# Patient Record
Sex: Female | Born: 1955 | Race: White | Hispanic: No | Marital: Married
Health system: Southern US, Community
[De-identification: ages and names within clinical notes are randomized; demographics above are authoritative.]

---

## 2003-12-03 ENCOUNTER — Inpatient Hospital Stay (HOSPITAL_COMMUNITY): Admission: RE | Admit: 2003-12-03 | Discharge: 2003-12-06 | Payer: Self-pay | Admitting: Orthopedic Surgery

## 2012-09-14 DIAGNOSIS — Z0000 Encounter for general adult medical examination without abnormal findings: Secondary | ICD-10-CM | POA: Insufficient documentation

## 2013-04-24 DIAGNOSIS — F331 Major depressive disorder, recurrent, moderate: Secondary | ICD-10-CM | POA: Insufficient documentation

## 2013-04-24 DIAGNOSIS — F411 Generalized anxiety disorder: Secondary | ICD-10-CM | POA: Insufficient documentation

## 2015-05-13 DIAGNOSIS — E782 Mixed hyperlipidemia: Secondary | ICD-10-CM | POA: Insufficient documentation

## 2015-05-13 DIAGNOSIS — M5136 Other intervertebral disc degeneration, lumbar region: Secondary | ICD-10-CM | POA: Insufficient documentation

## 2015-05-13 DIAGNOSIS — H9190 Unspecified hearing loss, unspecified ear: Secondary | ICD-10-CM | POA: Insufficient documentation

## 2015-05-13 DIAGNOSIS — N959 Unspecified menopausal and perimenopausal disorder: Secondary | ICD-10-CM | POA: Insufficient documentation

## 2015-05-13 DIAGNOSIS — I10 Essential (primary) hypertension: Secondary | ICD-10-CM | POA: Insufficient documentation

## 2015-05-13 DIAGNOSIS — K219 Gastro-esophageal reflux disease without esophagitis: Secondary | ICD-10-CM | POA: Insufficient documentation

## 2015-05-13 DIAGNOSIS — Z79899 Other long term (current) drug therapy: Secondary | ICD-10-CM | POA: Insufficient documentation

## 2015-05-13 DIAGNOSIS — R5383 Other fatigue: Secondary | ICD-10-CM | POA: Insufficient documentation

## 2015-05-13 DIAGNOSIS — E559 Vitamin D deficiency, unspecified: Secondary | ICD-10-CM | POA: Insufficient documentation

## 2015-10-28 DIAGNOSIS — E669 Obesity, unspecified: Secondary | ICD-10-CM | POA: Insufficient documentation

## 2017-05-26 DIAGNOSIS — M159 Polyosteoarthritis, unspecified: Secondary | ICD-10-CM | POA: Insufficient documentation

## 2017-11-16 DIAGNOSIS — I83811 Varicose veins of right lower extremities with pain: Secondary | ICD-10-CM | POA: Insufficient documentation

## 2018-03-02 DIAGNOSIS — M791 Myalgia, unspecified site: Secondary | ICD-10-CM | POA: Insufficient documentation

## 2018-11-07 DIAGNOSIS — G8929 Other chronic pain: Secondary | ICD-10-CM | POA: Insufficient documentation

## 2019-06-13 DIAGNOSIS — Z800 Family history of malignant neoplasm of digestive organs: Secondary | ICD-10-CM | POA: Insufficient documentation

## 2019-08-17 DIAGNOSIS — R7303 Prediabetes: Secondary | ICD-10-CM | POA: Insufficient documentation

## 2019-09-14 ENCOUNTER — Encounter: Payer: Self-pay | Admitting: Sports Medicine

## 2019-09-14 ENCOUNTER — Ambulatory Visit (INDEPENDENT_AMBULATORY_CARE_PROVIDER_SITE_OTHER): Payer: Medicare HMO

## 2019-09-14 ENCOUNTER — Other Ambulatory Visit: Payer: Self-pay | Admitting: Sports Medicine

## 2019-09-14 ENCOUNTER — Other Ambulatory Visit: Payer: Self-pay

## 2019-09-14 ENCOUNTER — Ambulatory Visit: Payer: Medicare HMO | Admitting: Sports Medicine

## 2019-09-14 DIAGNOSIS — M79672 Pain in left foot: Secondary | ICD-10-CM

## 2019-09-14 DIAGNOSIS — M779 Enthesopathy, unspecified: Secondary | ICD-10-CM

## 2019-09-14 DIAGNOSIS — M722 Plantar fascial fibromatosis: Secondary | ICD-10-CM | POA: Diagnosis not present

## 2019-09-14 DIAGNOSIS — M216X2 Other acquired deformities of left foot: Secondary | ICD-10-CM | POA: Diagnosis not present

## 2019-09-14 DIAGNOSIS — M7662 Achilles tendinitis, left leg: Secondary | ICD-10-CM

## 2019-09-14 DIAGNOSIS — M79671 Pain in right foot: Secondary | ICD-10-CM

## 2019-09-14 MED ORDER — PREDNISONE 10 MG (21) PO TBPK: ORAL_TABLET | ORAL | 0 refills | Status: DC

## 2019-09-14 NOTE — Patient Instructions (Signed)

## 2019-09-14 NOTE — Progress Notes (Signed)
Subjective: Anna Mclaughlin is a 64 y.o. female patient who presents to office for evaluation of Left heel pain. Patient complains of progressive pain especially over the last year in the Left foot at the Achilles x 2-3 years with knot at the back. Ranks pain 10/10 and is now interferring with daily activities. Patient has tried stretching and OTC inserts with no complete relief in symptoms. Patient denies any other pedal complaints.   Review of Systems  All other systems reviewed and are negative.   There are no problems to display for this patient.   Current Outpatient Medications on File Prior to Visit  Medication Sig Dispense Refill   Armodafinil 150 MG tablet Take 1/2 to 1 a day     citalopram (CELEXA) 20 MG tablet Take 1 a day     DULoxetine (CYMBALTA) 60 MG capsule Take by mouth.     gabapentin (NEURONTIN) 300 MG capsule      gemfibrozil (LOPID) 600 MG tablet TAKE 1 TABLET BY MOUTH TWICE (2) DAILY     omeprazole (PRILOSEC) 20 MG capsule Take 20 mg by mouth daily.     piroxicam (FELDENE) 20 MG capsule Take 20 mg by mouth daily.     quinapril-hydrochlorothiazide (ACCURETIC) 20-25 MG tablet      tiZANidine (ZANAFLEX) 4 MG tablet Take by mouth.     traMADol (ULTRAM) 50 MG tablet      traZODone (DESYREL) 100 MG tablet Take 100 mg by mouth at bedtime.     No current facility-administered medications on file prior to visit.    Allergies  Allergen Reactions   Sulfa Antibiotics Hives   Penicillins Other (See Comments) and Rash    Objective:  General: Alert and oriented x3 in no acute distress  Dermatology: No open lesions bilateral lower extremities, no webspace macerations, no ecchymosis bilateral, all nails x 10 are well manicured.  Vascular: Dorsalis Pedis and Posterior Tibial pedal pulses 1/4, Capillary Fill Time 3 seconds, + pedal hair growth bilateral, no edema bilateral lower extremities, Temperature gradient within normal limits.  Neurology: Michaell Cowing sensation  intact via light touch bilateral.  Musculoskeletal: Mild tenderness with palpation at insertion of the Achilles on Left, there is calcaneal exostosis with mild soft tissue present and decreased ankle rom with knee extending  vs flexed resembling gastroc equnius bilateral, The achilles tendon feels intact with no nodularity or palpable dell, Thompson sign negative, Subtalar and midtarsal joint range of motion is within normal limits, there is no 1st ray hypermobility or forefoot deformity noted bilateral.   Gait: Antalgic gait with increased heel off left  Xrays  Left Foot    Impression: Normal osseous mineralization. Joint spaces preserved. No fracture/dislocation/boney destruction. ++ Calcaneal spur present. Kager's triangle intact with no obliteration. No soft tissue abnormalities or radiopaque foreign bodies.   Assessment and Plan: Problem List Items Addressed This Visit    None    Visit Diagnoses    Tendonitis, Achilles, left    -  Primary   Pain of left heel       Bone spur       Acquired equinus deformity of left foot          -Complete examination performed -Xrays reviewed -Discussed treatment options -Rx predisone dose pack heel lifts, gentle stretching, and Night splint -No improvement will consider MRI/PT/EPAT -Patient to return to office in 4 weeks  or sooner if condition worsens.  [name], DPM

## 2019-09-17 ENCOUNTER — Other Ambulatory Visit: Payer: Self-pay | Admitting: Sports Medicine

## 2019-09-17 DIAGNOSIS — M722 Plantar fascial fibromatosis: Secondary | ICD-10-CM

## 2019-10-05 ENCOUNTER — Other Ambulatory Visit: Payer: Self-pay

## 2019-10-05 ENCOUNTER — Encounter: Payer: Self-pay | Admitting: Sports Medicine

## 2019-10-05 ENCOUNTER — Ambulatory Visit: Payer: Medicare HMO | Admitting: Sports Medicine

## 2019-10-05 DIAGNOSIS — M216X2 Other acquired deformities of left foot: Secondary | ICD-10-CM | POA: Diagnosis not present

## 2019-10-05 DIAGNOSIS — M779 Enthesopathy, unspecified: Secondary | ICD-10-CM

## 2019-10-05 DIAGNOSIS — M79672 Pain in left foot: Secondary | ICD-10-CM | POA: Diagnosis not present

## 2019-10-05 DIAGNOSIS — M7662 Achilles tendinitis, left leg: Secondary | ICD-10-CM | POA: Diagnosis not present

## 2019-10-05 DIAGNOSIS — S86012S Strain of left Achilles tendon, sequela: Secondary | ICD-10-CM

## 2019-10-05 NOTE — Progress Notes (Signed)
Subjective: Anna Mclaughlin is a 64 y.o. female patient who returns to office for follow up evaluation of Left heel pain. Patient reports that pain is now worse, very severe, back of heel is worse after 8 hour shift with sharp pain, + swelling with constant throbbing. Patient reports that her pain meds and her nerve meds do not help.    There are no problems to display for this patient.   Current Outpatient Medications on File Prior to Visit  Medication Sig Dispense Refill  . Armodafinil 150 MG tablet Take 1/2 to 1 a day    . citalopram (CELEXA) 20 MG tablet Take 1 a day    . DULoxetine (CYMBALTA) 60 MG capsule Take by mouth.    . gabapentin (NEURONTIN) 300 MG capsule     . gemfibrozil (LOPID) 600 MG tablet TAKE 1 TABLET BY MOUTH TWICE (2) DAILY    . omeprazole (PRILOSEC) 20 MG capsule Take 20 mg by mouth daily.    . piroxicam (FELDENE) 20 MG capsule Take 20 mg by mouth daily.    . predniSONE (STERAPRED UNI-PAK 21 TAB) 10 MG (21) TBPK tablet Take as directed 21 tablet 0  . quinapril-hydrochlorothiazide (ACCURETIC) 20-25 MG tablet     . tiZANidine (ZANAFLEX) 4 MG tablet Take by mouth.    . traMADol (ULTRAM) 50 MG tablet     . traZODone (DESYREL) 100 MG tablet Take 100 mg by mouth at bedtime.     No current facility-administered medications on file prior to visit.    Allergies  Allergen Reactions  . Sulfa Antibiotics Hives  . Penicillins Other (See Comments) and Rash    Objective:  General: Alert and oriented x3 in no acute distress  Dermatology: No open lesions bilateral lower extremities, no webspace macerations, no ecchymosis bilateral, all nails x 10 are well manicured.  Vascular: Dorsalis Pedis and Posterior Tibial pedal pulses 1/4, Capillary Fill Time 3 seconds, + pedal hair growth bilateral, no edema bilateral lower extremities, Temperature gradient within normal limits.  Neurology: Michaell Cowing sensation intact via light touch bilateral.  Musculoskeletal: Moderate tenderness  with palpation at insertion of the Achilles on Left, there is calcaneal exostosis with mild soft tissue swelling present and decreased ankle rom with knee extending  vs flexed resembling gastroc equnius bilateral, The achilles tendon feels intact with no nodularity or palpable dell, Thompson sign negative.   Assessment and Plan: Problem List Items Addressed This Visit    None    Visit Diagnoses    Partial Achilles tendon tear, left, sequela    -  Primary   Relevant Orders   MR ANKLE LEFT WO CONTRAST   Tendonitis, Achilles, left       Relevant Orders   MR ANKLE LEFT WO CONTRAST   Pain of left heel       Bone spur       Acquired equinus deformity of left foot          -Complete examination performed -Discussed treatment options for increased achilles pain with concern for partial tear -Rx MRI left ankle to eval for tear since she is no better with conservative care with increase in pain -Rx CAM boot with toe guard  -Continue with gentle stretching, and Night splint with ice pack as tolerated -Continue with chronic pain and nerve meds -Patient to return to office after MRI  or sooner if condition worsens.  [name], DPM

## 2019-10-31 ENCOUNTER — Other Ambulatory Visit: Payer: Self-pay

## 2019-10-31 ENCOUNTER — Ambulatory Visit
Admission: RE | Admit: 2019-10-31 | Discharge: 2019-10-31 | Disposition: A | Discharge status: Home / Self Care | Payer: Medicare HMO | Source: Ambulatory Visit | Attending: Sports Medicine | Admitting: Sports Medicine

## 2019-10-31 DIAGNOSIS — S86012S Strain of left Achilles tendon, sequela: Secondary | ICD-10-CM

## 2019-10-31 DIAGNOSIS — M7662 Achilles tendinitis, left leg: Secondary | ICD-10-CM

## 2019-11-08 ENCOUNTER — Telehealth: Payer: Self-pay | Admitting: *Deleted

## 2019-11-08 NOTE — Telephone Encounter (Signed)
Called and relayed the message per Dr Marylene Land and sent Lurena Joiner a message to make an appointment for the patient. Misty Stanley

## 2019-11-08 NOTE — Telephone Encounter (Signed)
-----   Message from [name], [place] sent at 11/07/2019 12:31 PM EDT ----- Will you let patient know that her MRI showed that she had inflammation at her tendon and at her achilles like I expected there is a small tear. She should keep with her CAM boot and come to office for [place] to further discuss plan of care ----- Message ----- From: Lanney Gins, [facility] Sent: 11/07/2019   8:36 AM EDT To: [name], DPM  Hi Dr Stover-patient called and had the MRI done and has been 5 days and no one has called patient to let her know what the results were. Misty Stanley

## 2019-11-20 ENCOUNTER — Ambulatory Visit (INDEPENDENT_AMBULATORY_CARE_PROVIDER_SITE_OTHER): Payer: Medicare HMO | Admitting: Sports Medicine

## 2019-11-20 ENCOUNTER — Other Ambulatory Visit: Payer: Self-pay

## 2019-11-20 ENCOUNTER — Encounter: Payer: Self-pay | Admitting: Sports Medicine

## 2019-11-20 DIAGNOSIS — M79672 Pain in left foot: Secondary | ICD-10-CM

## 2019-11-20 DIAGNOSIS — S86012S Strain of left Achilles tendon, sequela: Secondary | ICD-10-CM | POA: Diagnosis not present

## 2019-11-20 DIAGNOSIS — M216X2 Other acquired deformities of left foot: Secondary | ICD-10-CM

## 2019-11-20 DIAGNOSIS — M7662 Achilles tendinitis, left leg: Secondary | ICD-10-CM

## 2019-11-20 DIAGNOSIS — M779 Enthesopathy, unspecified: Secondary | ICD-10-CM

## 2019-11-20 NOTE — Progress Notes (Signed)
Subjective: Anna Mclaughlin is a 64 y.o. female patient who returns to office for follow up evaluation of Left heel pain and discussion of MRI results. Patient reports that pain is doing a little better but still has some swelling and throbbing and reports that the boot rubs the back of her heel making a burning sensation to the area. Patient denies any changes in medical history since last encounter.   Patient Active Problem List   Diagnosis Date Noted  . Prediabetes 08/17/2019  . Family history of malignant neoplasm of digestive organs 06/13/2019  . Chronic right-sided thoracic back pain 11/07/2018  . Myalgia 03/02/2018  . Varicose veins of right lower extremity with pain 11/16/2017  . Primary osteoarthritis involving multiple joints 05/26/2017  . Obesity (BMI 30-39.9) 10/28/2015  . Degeneration of lumbar intervertebral disc 05/13/2015  . Essential hypertension 05/13/2015  . Gastroesophageal reflux disease without esophagitis 05/13/2015  . Hearing loss 05/13/2015  . High risk medication use 05/13/2015  . Malaise and fatigue 05/13/2015  . Menopausal disorder 05/13/2015  . Mixed hyperlipidemia 05/13/2015  . Vitamin D deficiency 05/13/2015  . Generalized anxiety disorder 04/24/2013  . Moderate episode of recurrent major depressive disorder (HCC) 04/24/2013  . Routine history and physical examination of adult 09/14/2012  . Meniere's disease 07/22/2012    Current Outpatient Medications on File Prior to Visit  Medication Sig Dispense Refill  . Armodafinil 150 MG tablet Take 1/2 to 1 a day    . citalopram (CELEXA) 20 MG tablet Take 1 a day    . DULoxetine (CYMBALTA) 60 MG capsule Take by mouth.    . gabapentin (NEURONTIN) 300 MG capsule     . gemfibrozil (LOPID) 600 MG tablet TAKE 1 TABLET BY MOUTH TWICE (2) DAILY    . omeprazole (PRILOSEC) 20 MG capsule Take 20 mg by mouth daily.    . piroxicam (FELDENE) 20 MG capsule Take 20 mg by mouth daily.    . predniSONE (STERAPRED UNI-PAK 21  TAB) 10 MG (21) TBPK tablet Take as directed 21 tablet 0  . quinapril-hydrochlorothiazide (ACCURETIC) 20-25 MG tablet     . tiZANidine (ZANAFLEX) 4 MG tablet Take by mouth.    . traMADol (ULTRAM) 50 MG tablet     . traZODone (DESYREL) 100 MG tablet Take 100 mg by mouth at bedtime.     No current facility-administered medications on file prior to visit.    Allergies  Allergen Reactions  . Sulfa Antibiotics Hives  . Penicillins Other (See Comments) and Rash    Objective:  General: Alert and oriented x3 in no acute distress  Dermatology: No open lesions bilateral lower extremities, no webspace macerations, no ecchymosis bilateral, all nails x 10 are well manicured.  Vascular: Dorsalis Pedis and Posterior Tibial pedal pulses 1/4, Capillary Fill Time 3 seconds, + pedal hair growth bilateral, no edema bilateral lower extremities, Temperature gradient within normal limits.  Neurology: Michaell Cowing sensation intact via light touch bilateral.  Musculoskeletal: Moderate tenderness with palpation at insertion of the Achilles on Left, there is calcaneal exostosis with mild soft tissue swelling present and decreased ankle rom with knee extending  vs flexed resembling gastroc equnius bilateral, The achilles tendon feels intact with no nodularity or palpable dell, Thompson sign negative.  There is no pain to palpation to the lateral or medial ankle on the left.  MRI 9/15 IMPRESSION: 1. Moderate to severe mid to distal Achilles tendinosis with small partial tear. 2. Small peroneal brevis tendon split tear inferior to the lateral  malleolus. 3. 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 4. Mild posterior tibial tenosynovitis.   Assessment and Plan: Problem List Items Addressed This Visit    None    Visit Diagnoses    Partial Achilles tendon tear, left, sequela    -  Primary   Tendonitis, Achilles, left       Pain of  left heel       Bone spur       Acquired equinus deformity of left foot          -Complete examination performed -Discussed treatment options for continued pain at Achilles with partial tear -MRI results reviewed -Patient at this time declines surgery and wants to try conservative care dispensed Tri-Lock brace for patient to use with heel lift to see if this will give her some protection relief states she is rubbing in the boot -Continue with gentle stretching, and Night splint with ice pack as tolerated -Continue with chronic pain and nerve meds -Patient to return if fails to continue to improve after 2 to 3 weeks or sooner if condition worsens.  Advised patient to call if she wants to try physical therapy or if she wants to try a postoperative shoe with heel lifts if her brace fails to give her relief.  [name], DPM

## 2020-02-19 DIAGNOSIS — R1031 Right lower quadrant pain: Secondary | ICD-10-CM | POA: Diagnosis not present

## 2020-02-27 DIAGNOSIS — R5383 Other fatigue: Secondary | ICD-10-CM | POA: Diagnosis not present

## 2020-02-27 DIAGNOSIS — I10 Essential (primary) hypertension: Secondary | ICD-10-CM | POA: Diagnosis not present

## 2020-02-27 DIAGNOSIS — Z79899 Other long term (current) drug therapy: Secondary | ICD-10-CM | POA: Diagnosis not present

## 2020-02-27 DIAGNOSIS — M8949 Other hypertrophic osteoarthropathy, multiple sites: Secondary | ICD-10-CM | POA: Diagnosis not present

## 2020-02-27 DIAGNOSIS — R7303 Prediabetes: Secondary | ICD-10-CM | POA: Diagnosis not present

## 2020-02-27 DIAGNOSIS — R69 Illness, unspecified: Secondary | ICD-10-CM | POA: Diagnosis not present

## 2020-02-27 DIAGNOSIS — R5381 Other malaise: Secondary | ICD-10-CM | POA: Diagnosis not present

## 2020-02-27 DIAGNOSIS — E782 Mixed hyperlipidemia: Secondary | ICD-10-CM | POA: Diagnosis not present

## 2020-02-27 DIAGNOSIS — K219 Gastro-esophageal reflux disease without esophagitis: Secondary | ICD-10-CM | POA: Diagnosis not present

## 2020-02-27 DIAGNOSIS — M5136 Other intervertebral disc degeneration, lumbar region: Secondary | ICD-10-CM | POA: Diagnosis not present

## 2020-02-27 DIAGNOSIS — Z23 Encounter for immunization: Secondary | ICD-10-CM | POA: Diagnosis not present

## 2020-03-07 ENCOUNTER — Ambulatory Visit: Payer: Medicare HMO | Admitting: Sports Medicine

## 2020-03-07 DIAGNOSIS — F5101 Primary insomnia: Secondary | ICD-10-CM | POA: Diagnosis not present

## 2020-03-07 DIAGNOSIS — F411 Generalized anxiety disorder: Secondary | ICD-10-CM | POA: Diagnosis not present

## 2020-03-07 DIAGNOSIS — R5383 Other fatigue: Secondary | ICD-10-CM | POA: Insufficient documentation

## 2020-03-07 DIAGNOSIS — R69 Illness, unspecified: Secondary | ICD-10-CM | POA: Diagnosis not present

## 2020-03-07 DIAGNOSIS — Z79899 Other long term (current) drug therapy: Secondary | ICD-10-CM | POA: Diagnosis not present

## 2020-03-24 DIAGNOSIS — Z20822 Contact with and (suspected) exposure to covid-19: Secondary | ICD-10-CM | POA: Diagnosis not present

## 2020-03-24 DIAGNOSIS — J0100 Acute maxillary sinusitis, unspecified: Secondary | ICD-10-CM | POA: Diagnosis not present

## 2020-04-04 ENCOUNTER — Encounter: Payer: Self-pay | Admitting: Sports Medicine

## 2020-04-04 ENCOUNTER — Other Ambulatory Visit: Payer: Self-pay

## 2020-04-04 ENCOUNTER — Ambulatory Visit: Payer: Medicare HMO | Admitting: Sports Medicine

## 2020-04-04 DIAGNOSIS — S86012S Strain of left Achilles tendon, sequela: Secondary | ICD-10-CM | POA: Diagnosis not present

## 2020-04-04 DIAGNOSIS — M7662 Achilles tendinitis, left leg: Secondary | ICD-10-CM | POA: Diagnosis not present

## 2020-04-04 DIAGNOSIS — M216X2 Other acquired deformities of left foot: Secondary | ICD-10-CM | POA: Diagnosis not present

## 2020-04-04 DIAGNOSIS — M79672 Pain in left foot: Secondary | ICD-10-CM

## 2020-04-04 DIAGNOSIS — M779 Enthesopathy, unspecified: Secondary | ICD-10-CM

## 2020-04-04 MED ORDER — DICLOFENAC SODIUM 75 MG PO TBEC: 75.0000 mg | DELAYED_RELEASE_TABLET | Freq: Two times a day (BID) | ORAL | 0 refills | Status: DC

## 2020-04-04 NOTE — Progress Notes (Signed)
Subjective: Anna Mclaughlin is a 65 y.o. female patient who returns to office for follow up evaluation of Left heel pain.  Patient reports that she is still having pain states that the pain is about the same and that the brace hurt the back of the heel but the night splint and icing seem to help.  Patient is here to discuss options and also states that she has some dry skin at the back of her left heel.  Currently using foot cream.  No other pedal complaints noted.   Patient Active Problem List   Diagnosis Date Noted  . Fatigue 03/07/2020  . Prediabetes 08/17/2019  . Family history of malignant neoplasm of digestive organs 06/13/2019  . Chronic right-sided thoracic back pain 11/07/2018  . Myalgia 03/02/2018  . Varicose veins of right lower extremity with pain 11/16/2017  . Primary osteoarthritis involving multiple joints 05/26/2017  . Obesity (BMI 30-39.9) 10/28/2015  . Degeneration of lumbar intervertebral disc 05/13/2015  . Essential hypertension 05/13/2015  . Gastroesophageal reflux disease without esophagitis 05/13/2015  . Hearing loss 05/13/2015  . High risk medication use 05/13/2015  . Malaise and fatigue 05/13/2015  . Menopausal disorder 05/13/2015  . Mixed hyperlipidemia 05/13/2015  . Vitamin D deficiency 05/13/2015  . Generalized anxiety disorder 04/24/2013  . Moderate episode of recurrent major depressive disorder (HCC) 04/24/2013  . Routine history and physical examination of adult 09/14/2012  . Meniere's disease 07/22/2012    Current Outpatient Medications on File Prior to Visit  Medication Sig Dispense Refill  . Armodafinil 150 MG tablet Take 1/2 to 1 a day    . citalopram (CELEXA) 20 MG tablet Take 1 a day    . DULoxetine (CYMBALTA) 60 MG capsule Take by mouth.    . gabapentin (NEURONTIN) 300 MG capsule     . gemfibrozil (LOPID) 600 MG tablet TAKE 1 TABLET BY MOUTH TWICE (2) DAILY    . omeprazole (PRILOSEC) 20 MG capsule Take 20 mg by mouth daily.    . piroxicam  (FELDENE) 20 MG capsule Take 20 mg by mouth daily.    . predniSONE (STERAPRED UNI-PAK 21 TAB) 10 MG (21) TBPK tablet Take as directed 21 tablet 0  . quinapril-hydrochlorothiazide (ACCURETIC) 20-25 MG tablet     . tiZANidine (ZANAFLEX) 4 MG tablet Take by mouth.    . traMADol (ULTRAM) 50 MG tablet     . traZODone (DESYREL) 100 MG tablet Take 100 mg by mouth at bedtime.     No current facility-administered medications on file prior to visit.    Allergies  Allergen Reactions  . Sulfa Antibiotics Hives  . Penicillins Other (See Comments) and Rash    Objective:  General: Alert and oriented x3 in no acute distress  Dermatology: No open lesions bilateral lower extremities, no webspace macerations, no ecchymosis bilateral, all nails x 10 are well manicured.  Mild reactive dry skin noted to the posterior heel on the left likely secondary to shear from shoes.  Vascular: Dorsalis Pedis and Posterior Tibial pedal pulses 1/4, Capillary Fill Time 3 seconds, + pedal hair growth bilateral, no edema bilateral lower extremities, Temperature gradient within normal limits.  Neurology: Michaell Cowing sensation intact via light touch bilateral.  Musculoskeletal: Mild to moderate tenderness with palpation at insertion of the Achilles on Left, there is calcaneal exostosis with mild soft tissue swelling present and decreased ankle rom with knee extending  vs flexed resembling gastroc equnius bilateral, The achilles tendon feels intact with no nodularity or palpable dell, Anna Mclaughlin  sign negative.  There is no pain to palpation to the lateral or medial ankle or foot on the left.  MRI 9/15 IMPRESSION: 1. Moderate to severe mid to distal Achilles tendinosis with small partial tear. 2. Small peroneal brevis tendon split tear inferior to the lateral malleolus. 3. 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 4. Mild posterior tibial  tenosynovitis.   Assessment and Plan: Problem List Items Addressed This Visit   None   Visit Diagnoses    Partial Achilles tendon tear, left, sequela    -  Primary   Tendonitis, Achilles, left       Pain of left heel       Bone spur       Acquired equinus deformity of left foot          -Complete examination performed -Re-Discussed treatment options for continued pain at Achilles with partial tear -MRI results reviewed again -Patient at this time wants to continue with conservative care and wants to consider surgery as a last option -Rx [facility] at [facility] -Continue with gentle stretching, and Night splint with ice pack as tolerated -Recommend good supportive shoes daily for foot type -Continue with chronic pain and nerve meds like previous and changed meloxicam to diclofenac to see if patient will tolerate this better for pain and inflammation -Advised patient to continue with foot cream for dry skin at posterior heel and to use with Saran wrap under occlusion -Patient to return in 6 weeks for follow-up to see how she is doing once she has started physical therapy.  Anna Mclaughlin, DPM

## 2020-04-14 DIAGNOSIS — M25572 Pain in left ankle and joints of left foot: Secondary | ICD-10-CM | POA: Diagnosis not present

## 2020-04-14 DIAGNOSIS — M79672 Pain in left foot: Secondary | ICD-10-CM | POA: Diagnosis not present

## 2020-04-14 DIAGNOSIS — M25672 Stiffness of left ankle, not elsewhere classified: Secondary | ICD-10-CM | POA: Diagnosis not present

## 2020-04-14 DIAGNOSIS — M7662 Achilles tendinitis, left leg: Secondary | ICD-10-CM | POA: Diagnosis not present

## 2020-04-14 DIAGNOSIS — R2689 Other abnormalities of gait and mobility: Secondary | ICD-10-CM | POA: Diagnosis not present

## 2020-04-18 DIAGNOSIS — M25572 Pain in left ankle and joints of left foot: Secondary | ICD-10-CM | POA: Diagnosis not present

## 2020-04-18 DIAGNOSIS — M79672 Pain in left foot: Secondary | ICD-10-CM | POA: Diagnosis not present

## 2020-04-18 DIAGNOSIS — M25672 Stiffness of left ankle, not elsewhere classified: Secondary | ICD-10-CM | POA: Diagnosis not present

## 2020-04-18 DIAGNOSIS — R2689 Other abnormalities of gait and mobility: Secondary | ICD-10-CM | POA: Diagnosis not present

## 2020-04-18 DIAGNOSIS — M7662 Achilles tendinitis, left leg: Secondary | ICD-10-CM | POA: Diagnosis not present

## 2020-04-25 DIAGNOSIS — M25672 Stiffness of left ankle, not elsewhere classified: Secondary | ICD-10-CM | POA: Diagnosis not present

## 2020-04-25 DIAGNOSIS — R2689 Other abnormalities of gait and mobility: Secondary | ICD-10-CM | POA: Diagnosis not present

## 2020-04-25 DIAGNOSIS — M25572 Pain in left ankle and joints of left foot: Secondary | ICD-10-CM | POA: Diagnosis not present

## 2020-04-25 DIAGNOSIS — M7662 Achilles tendinitis, left leg: Secondary | ICD-10-CM | POA: Diagnosis not present

## 2020-04-25 DIAGNOSIS — M79672 Pain in left foot: Secondary | ICD-10-CM | POA: Diagnosis not present

## 2020-04-28 DIAGNOSIS — A09 Infectious gastroenteritis and colitis, unspecified: Secondary | ICD-10-CM | POA: Diagnosis not present

## 2020-05-02 DIAGNOSIS — M25672 Stiffness of left ankle, not elsewhere classified: Secondary | ICD-10-CM | POA: Diagnosis not present

## 2020-05-02 DIAGNOSIS — M25572 Pain in left ankle and joints of left foot: Secondary | ICD-10-CM | POA: Diagnosis not present

## 2020-05-02 DIAGNOSIS — M7662 Achilles tendinitis, left leg: Secondary | ICD-10-CM | POA: Diagnosis not present

## 2020-05-02 DIAGNOSIS — M79672 Pain in left foot: Secondary | ICD-10-CM | POA: Diagnosis not present

## 2020-05-02 DIAGNOSIS — R2689 Other abnormalities of gait and mobility: Secondary | ICD-10-CM | POA: Diagnosis not present

## 2020-05-09 DIAGNOSIS — Z1231 Encounter for screening mammogram for malignant neoplasm of breast: Secondary | ICD-10-CM | POA: Diagnosis not present

## 2020-05-12 DIAGNOSIS — R928 Other abnormal and inconclusive findings on diagnostic imaging of breast: Secondary | ICD-10-CM | POA: Insufficient documentation

## 2020-05-15 DIAGNOSIS — M8949 Other hypertrophic osteoarthropathy, multiple sites: Secondary | ICD-10-CM | POA: Diagnosis not present

## 2020-05-16 ENCOUNTER — Ambulatory Visit: Payer: Medicare HMO | Admitting: Sports Medicine

## 2020-05-16 ENCOUNTER — Other Ambulatory Visit: Payer: Self-pay

## 2020-05-16 ENCOUNTER — Encounter: Payer: Self-pay | Admitting: Sports Medicine

## 2020-05-16 DIAGNOSIS — M79672 Pain in left foot: Secondary | ICD-10-CM

## 2020-05-16 DIAGNOSIS — M7662 Achilles tendinitis, left leg: Secondary | ICD-10-CM | POA: Diagnosis not present

## 2020-05-16 DIAGNOSIS — S86012S Strain of left Achilles tendon, sequela: Secondary | ICD-10-CM

## 2020-05-16 DIAGNOSIS — M216X2 Other acquired deformities of left foot: Secondary | ICD-10-CM

## 2020-05-16 DIAGNOSIS — M779 Enthesopathy, unspecified: Secondary | ICD-10-CM

## 2020-05-16 MED ORDER — TRIAMCINOLONE ACETONIDE 10 MG/ML IJ SUSP
10.0000 mg | Freq: Once | INTRAMUSCULAR | Status: AC
  Administered 2020-05-16: 10 mg

## 2020-05-16 MED ORDER — PREDNISONE 10 MG (21) PO TBPK: ORAL_TABLET | ORAL | 0 refills | Status: DC

## 2020-05-16 NOTE — Progress Notes (Signed)
Subjective: Anna Mclaughlin is a 65 y.o. female patient who returns to office for follow up evaluation of Left heel pain.  Patient reports that her heel is killing her states that she can barely walk has been limping due to the pain states that physical therapy seems to make pain worse and she is ready to discuss further options because her heel is really affecting her life.  Denies any other pedal complaints at this time.   Patient Active Problem List   Diagnosis Date Noted  . Fatigue 03/07/2020  . Prediabetes 08/17/2019  . Family history of malignant neoplasm of digestive organs 06/13/2019  . Chronic right-sided thoracic back pain 11/07/2018  . Myalgia 03/02/2018  . Varicose veins of right lower extremity with pain 11/16/2017  . Primary osteoarthritis involving multiple joints 05/26/2017  . Obesity (BMI 30-39.9) 10/28/2015  . Degeneration of lumbar intervertebral disc 05/13/2015  . Essential hypertension 05/13/2015  . Gastroesophageal reflux disease without esophagitis 05/13/2015  . Hearing loss 05/13/2015  . High risk medication use 05/13/2015  . Malaise and fatigue 05/13/2015  . Menopausal disorder 05/13/2015  . Mixed hyperlipidemia 05/13/2015  . Vitamin D deficiency 05/13/2015  . Generalized anxiety disorder 04/24/2013  . Moderate episode of recurrent major depressive disorder (HCC) 04/24/2013  . Routine history and physical examination of adult 09/14/2012  . Meniere's disease 07/22/2012    Current Outpatient Medications on File Prior to Visit  Medication Sig Dispense Refill  . Armodafinil 150 MG tablet Take 1/2 to 1 a day    . citalopram (CELEXA) 20 MG tablet Take 1 a day    . diclofenac (VOLTAREN) 75 MG EC tablet Take 1 tablet (75 mg total) by mouth 2 (two) times daily. 30 tablet 0  . DULoxetine (CYMBALTA) 60 MG capsule Take by mouth.    . gabapentin (NEURONTIN) 300 MG capsule     . gemfibrozil (LOPID) 600 MG tablet TAKE 1 TABLET BY MOUTH TWICE (2) DAILY    . omeprazole  (PRILOSEC) 20 MG capsule Take 20 mg by mouth daily.    . piroxicam (FELDENE) 20 MG capsule Take 20 mg by mouth daily.    . quinapril-hydrochlorothiazide (ACCURETIC) 20-25 MG tablet     . tiZANidine (ZANAFLEX) 4 MG tablet Take by mouth.    . traMADol (ULTRAM) 50 MG tablet     . traZODone (DESYREL) 100 MG tablet Take 100 mg by mouth at bedtime.     No current facility-administered medications on file prior to visit.    Allergies  Allergen Reactions  . Sulfa Antibiotics Hives  . Penicillins Other (See Comments) and Rash    Objective:  General: Alert and oriented x3 in no acute distress  Dermatology: No open lesions bilateral lower extremities, no webspace macerations, no ecchymosis bilateral, all nails x 10 are well manicured.  Mild reactive dry skin to heels bilateral.  Vascular: Dorsalis Pedis and Posterior Tibial pedal pulses 1/4, Capillary Fill Time 3 seconds, + pedal hair growth bilateral, no edema bilateral lower extremities, Temperature gradient within normal limits.  Neurology: Michaell Cowing sensation intact via light touch bilateral.  Musculoskeletal: Mild to moderate tenderness with palpation at insertion of the Achilles on Left, most pain today lateral> central> medial, there is calcaneal exostosis with mild soft tissue swelling present and decreased ankle rom with knee extending  vs flexed resembling gastroc equnius bilateral, The achilles tendon feels intact with no nodularity or palpable dell, Thompson sign negative.  There is no pain to palpation to the lateral or medial  ankle or foot on the left.   Assessment and Plan: Problem List Items Addressed This Visit   None   Visit Diagnoses    Tendonitis, Achilles, left    -  Primary   Partial Achilles tendon tear, left, sequela       Pain of left heel       Bone spur       Acquired equinus deformity of left foot          -Complete examination performed -Re-Discussed treatment options for continued pain at Achilles with  partial tear -Advised patient since she is hurting more progressively and therapy is making things worse to discontinue and to consider surgery.  Patient reports that she wants to wait to have surgery in June; advised her to return to office for surgery consult -Meanwhile to provide temporary relief prescribed prednisone for patient to take as directed and administered a local injection, After oral consent and aseptic prep, injected a mixture containing 1 ml of 2%  plain lidocaine, 1 ml 0.5% plain marcaine, 0.5 ml of kenalog 10 and 0.5 ml of dexamethasone phosphate into along the lateral insertion being careful not to further damage the Achilles tendon.  Patient tolerated injection well without complication. Post-injection care discussed with patient.  -Advised patient if pain continues may return to her walking boot that she has at home -Continue with gentle stretching, and Night splint with ice pack as tolerated -Recommend good supportive shoes daily for foot type -Patient to return as scheduled for surgery consult  [name], DPM

## 2020-06-04 DIAGNOSIS — R928 Other abnormal and inconclusive findings on diagnostic imaging of breast: Secondary | ICD-10-CM | POA: Diagnosis not present

## 2020-07-11 DIAGNOSIS — M9262 Juvenile osteochondrosis of tarsus, left ankle: Secondary | ICD-10-CM | POA: Diagnosis not present

## 2020-07-11 DIAGNOSIS — M79604 Pain in right leg: Secondary | ICD-10-CM | POA: Diagnosis not present

## 2020-07-18 ENCOUNTER — Ambulatory Visit (INDEPENDENT_AMBULATORY_CARE_PROVIDER_SITE_OTHER): Payer: Medicare HMO

## 2020-07-18 ENCOUNTER — Other Ambulatory Visit: Payer: Self-pay

## 2020-07-18 ENCOUNTER — Ambulatory Visit: Payer: Medicare HMO | Admitting: Sports Medicine

## 2020-07-18 ENCOUNTER — Encounter: Payer: Self-pay | Admitting: Sports Medicine

## 2020-07-18 DIAGNOSIS — M216X2 Other acquired deformities of left foot: Secondary | ICD-10-CM | POA: Diagnosis not present

## 2020-07-18 DIAGNOSIS — S86012S Strain of left Achilles tendon, sequela: Secondary | ICD-10-CM

## 2020-07-18 DIAGNOSIS — M79672 Pain in left foot: Secondary | ICD-10-CM

## 2020-07-18 DIAGNOSIS — M722 Plantar fascial fibromatosis: Secondary | ICD-10-CM

## 2020-07-18 DIAGNOSIS — M779 Enthesopathy, unspecified: Secondary | ICD-10-CM | POA: Diagnosis not present

## 2020-07-18 NOTE — Progress Notes (Signed)
Subjective: Anna Mclaughlin is a 65 y.o. female patient who returns to office for follow up evaluation of Left heel pain.  Patient reports that her heel is hurting really badly went for a walk on the beach and felt a pull in something pop and has had significant swelling at the back of her heel area since.  Patient reports that she has been resting icing elevating her gabapentin and tramadol helps with the pain but has not been wearing her cam boot because it rubs the back of her heel and seems to make her pain worse to where she cannot stand the.  Denies any other pedal complaints at this time.   Patient Active Problem List   Diagnosis Date Noted  . Fatigue 03/07/2020  . Prediabetes 08/17/2019  . Family history of malignant neoplasm of digestive organs 06/13/2019  . Chronic right-sided thoracic back pain 11/07/2018  . Myalgia 03/02/2018  . Varicose veins of right lower extremity with pain 11/16/2017  . Primary osteoarthritis involving multiple joints 05/26/2017  . Obesity (BMI 30-39.9) 10/28/2015  . Degeneration of lumbar intervertebral disc 05/13/2015  . Essential hypertension 05/13/2015  . Gastroesophageal reflux disease without esophagitis 05/13/2015  . Hearing loss 05/13/2015  . High risk medication use 05/13/2015  . Malaise and fatigue 05/13/2015  . Menopausal disorder 05/13/2015  . Mixed hyperlipidemia 05/13/2015  . Vitamin D deficiency 05/13/2015  . Generalized anxiety disorder 04/24/2013  . Moderate episode of recurrent major depressive disorder (HCC) 04/24/2013  . Routine history and physical examination of adult 09/14/2012  . Meniere's disease 07/22/2012    Current Outpatient Medications on File Prior to Visit  Medication Sig Dispense Refill  . Armodafinil 150 MG tablet Take 1/2 to 1 a day    . citalopram (CELEXA) 20 MG tablet Take 1 a day    . diclofenac (VOLTAREN) 75 MG EC tablet Take 1 tablet (75 mg total) by mouth 2 (two) times daily. 30 tablet 0  . DULoxetine  (CYMBALTA) 60 MG capsule Take by mouth.    . gabapentin (NEURONTIN) 300 MG capsule     . gemfibrozil (LOPID) 600 MG tablet TAKE 1 TABLET BY MOUTH TWICE (2) DAILY    . omeprazole (PRILOSEC) 20 MG capsule Take 20 mg by mouth daily.    . piroxicam (FELDENE) 20 MG capsule Take 20 mg by mouth daily.    . predniSONE (STERAPRED UNI-PAK 21 TAB) 10 MG (21) TBPK tablet Take as directed 21 tablet 0  . quinapril-hydrochlorothiazide (ACCURETIC) 20-25 MG tablet     . tiZANidine (ZANAFLEX) 4 MG tablet Take by mouth.    . traMADol (ULTRAM) 50 MG tablet     . traZODone (DESYREL) 100 MG tablet Take 100 mg by mouth at bedtime.     No current facility-administered medications on file prior to visit.    Allergies  Allergen Reactions  . Sulfa Antibiotics Hives  . Penicillins Other (See Comments) and Rash    Objective:  General: Alert and oriented x3 in no acute distress  Dermatology: No open lesions bilateral lower extremities, no webspace macerations, no ecchymosis bilateral, all nails x 10 are well manicured.  Mild reactive dry skin to heels bilateral.  Vascular: Dorsalis Pedis and Posterior Tibial pedal pulses 1/4, Capillary Fill Time 3 seconds, + pedal hair growth bilateral, no edema bilateral lower extremities, Temperature gradient within normal limits.  Neurology: Michaell Cowing sensation intact via light touch bilateral.  Musculoskeletal: Moderate tenderness with palpation at insertion of the Achilles on Left, most pain today  lateral> central> medial, there is calcaneal exostosis with moderate soft tissue swelling present and decreased ankle rom with knee extending  vs flexed resembling gastroc equnius bilateral, The achilles tendon feels intact partially with now a small area of nodularity and a small palpable dell noted to the medial aspect of her tendon at the watershed area, [place] sign negative with likely some fibers intact.  There is no pain to palpation to the lateral or medial ankle or foot on the  left.  There is some pain today to the plantar heel along the plantar fascial insertion on the left.  X-rays show significant posterior and inferior heel spur shows a lot of soft tissue swelling and mild impingement on the EKG's triangle   Assessment and Plan: Problem List Items Addressed This Visit   None   Visit Diagnoses    Partial Achilles tendon tear, left, sequela    -  Primary   Relevant Orders   DG Ankle Complete Left (Completed)   For home use only DME Other see comment   Plantar fasciitis       Bone spur       Pain of left heel       Acquired equinus deformity of left foot          -Complete examination performed -Re-Discussed treatment options for continued pain at Achilles with partial tear that has likely worsened with patient walking on the beach and with no heel pain -Advised patient since she is hurting more progressively with worsening symptoms of tear we need to proceed with surgical treatments -Patient opt for surgical management. Consent obtained for left Achilles tendon repair with removal of heel spur both posterior and plantar and release of plantar fascia.  Pre and Post op course explained. Risks, benefits, alternatives explained. No guarantees given or implied. Surgical booking slip submitted and provided patient with Surgical packet and info for [facility] -Patient to be placed in a posterior splint immediate postop -Continue rest ice elevation -Ordered knee scooter for patient to be nonweightbearing on the left lower extremity -Advised patient that she will need a least 3 months out of work to bring any work/FMLA/disability paperwork to my office to be completed on her behalf starting with disability now and for her postoperative recovery -Patient to return as scheduled after surgery or sooner if issues arise. [name], DPM

## 2020-07-22 ENCOUNTER — Telehealth: Payer: Self-pay

## 2020-07-22 NOTE — Telephone Encounter (Signed)
Rep. From [facility] called stating they don't carry knee scooters any more and Dr. Laury Axon to send the Rx for knee scooter to another faciltiy.

## 2020-07-24 DIAGNOSIS — H524 Presbyopia: Secondary | ICD-10-CM | POA: Diagnosis not present

## 2020-07-24 DIAGNOSIS — Z0100 Encounter for examination of eyes and vision without abnormal findings: Secondary | ICD-10-CM | POA: Diagnosis not present

## 2020-07-24 NOTE — Telephone Encounter (Signed)
Pt will come and pick up Rx today

## 2020-07-24 NOTE — Telephone Encounter (Signed)
HI Anna Mclaughlin, by any chance did Dr. Marylene Land gave you pt's Rx for knee scooter? Thanks

## 2020-07-25 ENCOUNTER — Telehealth: Payer: Self-pay | Admitting: Urology

## 2020-07-25 NOTE — Telephone Encounter (Signed)
DOS - 08/04/20   EPF LEFT --- 54562 REPAIR TENDON LEFT --- 28086 CALCANEAL OSTECTOMY LEFT --- 56389 HEEL SPUR RESECTION LEFT --- 37342   [employer] EFFECTIVE DATE - 05/17/19   SPOKE WITH AYUSH WITH [employer] AND HE STATED THAT FOR CPT CODE 87681, 15726, 20355 AND 97416 NO PRIOR AUTH IS REQUIRED.  REF # [id number]

## 2020-07-28 ENCOUNTER — Encounter: Payer: Self-pay | Admitting: Sports Medicine

## 2020-08-01 DIAGNOSIS — M79676 Pain in unspecified toe(s): Secondary | ICD-10-CM

## 2020-08-02 ENCOUNTER — Other Ambulatory Visit: Payer: Self-pay | Admitting: Sports Medicine

## 2020-08-02 DIAGNOSIS — Z9889 Other specified postprocedural states: Secondary | ICD-10-CM

## 2020-08-02 NOTE — Progress Notes (Signed)
Postoperative medications entered Creatinine clearance calculated at 111 mg/dL for Xarelto

## 2020-08-04 ENCOUNTER — Encounter: Payer: Self-pay | Admitting: Sports Medicine

## 2020-08-04 DIAGNOSIS — S86012A Strain of left Achilles tendon, initial encounter: Secondary | ICD-10-CM | POA: Diagnosis not present

## 2020-08-04 DIAGNOSIS — M7732 Calcaneal spur, left foot: Secondary | ICD-10-CM | POA: Diagnosis not present

## 2020-08-04 DIAGNOSIS — M25571 Pain in right ankle and joints of right foot: Secondary | ICD-10-CM | POA: Diagnosis not present

## 2020-08-04 DIAGNOSIS — M722 Plantar fascial fibromatosis: Secondary | ICD-10-CM | POA: Diagnosis not present

## 2020-08-04 DIAGNOSIS — M7662 Achilles tendinitis, left leg: Secondary | ICD-10-CM | POA: Diagnosis not present

## 2020-08-04 MED ORDER — DOCUSATE SODIUM 100 MG PO CAPS: 100.0000 mg | ORAL_CAPSULE | Freq: Two times a day (BID) | ORAL | 0 refills | Status: DC

## 2020-08-04 MED ORDER — HYDROCODONE-ACETAMINOPHEN 10-325 MG PO TABS: 1.0000 | ORAL_TABLET | Freq: Four times a day (QID) | ORAL | 0 refills | Status: AC | PRN

## 2020-08-04 MED ORDER — RIVAROXABAN 10 MG PO TABS: 10.0000 mg | ORAL_TABLET | Freq: Every day | ORAL | 0 refills | Status: AC

## 2020-08-04 MED ORDER — PROMETHAZINE HCL 25 MG PO TABS: 25.0000 mg | ORAL_TABLET | Freq: Three times a day (TID) | ORAL | 0 refills | Status: AC | PRN

## 2020-08-05 ENCOUNTER — Telehealth: Payer: Self-pay | Admitting: Sports Medicine

## 2020-08-05 NOTE — Telephone Encounter (Signed)
Postoperative check phone call made to patient.  Patient reports that she is doing good no pain currently.  Advised patient that this is common after having a nerve block and to continue with medication as the nerve block slowly starts to wear off.  Advised patient to continue with rest ice elevation and nonweightbearing to the left lower extremity.  I explained in detail to the patient the severity of her Achilles injury and the procedure that I had to do in order to help remedy her issue.  Patient expressed understanding and I advised patient because she is nonweightbearing to make sure she picks up her Xarelto.  Patient reports that her daughter will pick it up today because the pharmacy did not have it on yesterday.  Patient denies any other symptoms at this time.  I advised patient if there is any other postoperative questions or issues to call office.  Patient thanked me for calling and reports that the nurses at the surgical center were very kind to her. -Dr. Marylene Land

## 2020-08-12 ENCOUNTER — Encounter: Payer: Self-pay | Admitting: Sports Medicine

## 2020-08-12 ENCOUNTER — Ambulatory Visit (INDEPENDENT_AMBULATORY_CARE_PROVIDER_SITE_OTHER): Payer: Medicare HMO

## 2020-08-12 ENCOUNTER — Other Ambulatory Visit: Payer: Self-pay

## 2020-08-12 ENCOUNTER — Ambulatory Visit (INDEPENDENT_AMBULATORY_CARE_PROVIDER_SITE_OTHER): Payer: Medicare HMO | Admitting: Sports Medicine

## 2020-08-12 DIAGNOSIS — M79672 Pain in left foot: Secondary | ICD-10-CM | POA: Diagnosis not present

## 2020-08-12 DIAGNOSIS — S86012S Strain of left Achilles tendon, sequela: Secondary | ICD-10-CM

## 2020-08-12 DIAGNOSIS — M779 Enthesopathy, unspecified: Secondary | ICD-10-CM

## 2020-08-12 DIAGNOSIS — Z9889 Other specified postprocedural states: Secondary | ICD-10-CM

## 2020-08-12 DIAGNOSIS — M722 Plantar fascial fibromatosis: Secondary | ICD-10-CM

## 2020-08-12 DIAGNOSIS — M216X2 Other acquired deformities of left foot: Secondary | ICD-10-CM

## 2020-08-12 NOTE — Progress Notes (Signed)
Subjective: Anna Mclaughlin is a 65 y.o. female patient seen today in office for POV #1 DOS 08-04-20), S/P Left achilles tendon repair and EPF. Patient denies pain at surgical site, denies calf pain, denies headache, chest pain, shortness of breath, nausea, vomiting, fever, or chills. Patient states that she is doing well. No pain. No other issues noted.   Patient Active Problem List   Diagnosis Date Noted   Fatigue 03/07/2020   Prediabetes 08/17/2019   Family history of malignant neoplasm of digestive organs 06/13/2019   Chronic right-sided thoracic back pain 11/07/2018   Myalgia 03/02/2018   Varicose veins of right lower extremity with pain 11/16/2017   Primary osteoarthritis involving multiple joints 05/26/2017   Obesity (BMI 30-39.9) 10/28/2015   Degeneration of lumbar intervertebral disc 05/13/2015   Essential hypertension 05/13/2015   Gastroesophageal reflux disease without esophagitis 05/13/2015   Hearing loss 05/13/2015   High risk medication use 05/13/2015   Malaise and fatigue 05/13/2015   Menopausal disorder 05/13/2015   Mixed hyperlipidemia 05/13/2015   Vitamin D deficiency 05/13/2015   Generalized anxiety disorder 04/24/2013   Moderate episode of recurrent major depressive disorder (HCC) 04/24/2013   Routine history and physical examination of adult 09/14/2012   Meniere's disease 07/22/2012    Current Outpatient Medications on File Prior to Visit  Medication Sig Dispense Refill   Armodafinil 150 MG tablet Take 1/2 to 1 a day     citalopram (CELEXA) 20 MG tablet Take 1 a day     diclofenac (VOLTAREN) 75 MG EC tablet Take 1 tablet (75 mg total) by mouth 2 (two) times daily. 30 tablet 0   docusate sodium (COLACE) 100 MG capsule Take 1 capsule (100 mg total) by mouth 2 (two) times daily. 10 capsule 0   DULoxetine (CYMBALTA) 60 MG capsule Take by mouth.     gabapentin (NEURONTIN) 300 MG capsule      gemfibrozil (LOPID) 600 MG tablet TAKE 1 TABLET BY MOUTH TWICE (2) DAILY      omeprazole (PRILOSEC) 20 MG capsule Take 20 mg by mouth daily.     omeprazole (PRILOSEC) 40 MG capsule Take 40 mg by mouth daily.     piroxicam (FELDENE) 20 MG capsule Take 20 mg by mouth daily.     predniSONE (STERAPRED UNI-PAK 21 TAB) 10 MG (21) TBPK tablet Take as directed 21 tablet 0   promethazine (PHENERGAN) 25 MG tablet Take 1 tablet (25 mg total) by mouth every 8 (eight) hours as needed for nausea or vomiting. 20 tablet 0   quinapril-hydrochlorothiazide (ACCURETIC) 20-25 MG tablet      rivaroxaban (XARELTO) 10 MG TABS tablet Take 1 tablet (10 mg total) by mouth daily. 30 tablet 0   tiZANidine (ZANAFLEX) 4 MG tablet Take by mouth.     traMADol (ULTRAM) 50 MG tablet      traZODone (DESYREL) 100 MG tablet Take 100 mg by mouth at bedtime.     No current facility-administered medications on file prior to visit.    Allergies  Allergen Reactions   Sulfa Antibiotics Hives   Penicillins Other (See Comments) and Rash    Objective: There were no vitals filed for this visit.  General: No acute distress, AAOx3  Left foot: Sutures and staples intact with no gapping or dehiscence at surgical sites, mild swelling to left foot, no erythema, no warmth, no drainage, no signs of infection noted, Capillary fill time <3 seconds in all digits, gross sensation present via light touch to left foot. No  pain or crepitation with range of motion left foot.  No pain with calf compression.   Post Op Xray, Left foot: Calcaneal ostectomy, posterior, Soft tissue swelling within normal limits for post op status.   Assessment and Plan:  Problem List Items Addressed This Visit   None Visit Diagnoses     Partial Achilles tendon tear, left, sequela    -  Primary   Relevant Orders   DG Foot 2 Views Left   Plantar fasciitis       S/P foot surgery, left       Bone spur       Pain of left heel       Acquired equinus deformity of left foot            -Patient seen and evaluated -Xrays reviewed   -Re-applied posterior splint and dry sterile dressing to surgical site Left foot secured with ACE wrap and stockinet  -Advised patient to make sure to keep dressings clean, dry, and intact to left surgical site, removing the ACE as needed  -Advised patient to continue with nonweightbearing with use of crutches/scooter -Continue with Xarelto  -Advised patient to limit activity to necessity  -Advised patient to ice and elevate as instructed -Will plan for possible suture removal and reapplication of posterior splint at next office visit. In the meantime, patient to call office if any issues or problems arise.   Anna Mclaughlin, DPM

## 2020-08-21 ENCOUNTER — Encounter: Payer: Self-pay | Admitting: Podiatry

## 2020-08-21 ENCOUNTER — Other Ambulatory Visit: Payer: Self-pay

## 2020-08-21 ENCOUNTER — Ambulatory Visit (INDEPENDENT_AMBULATORY_CARE_PROVIDER_SITE_OTHER): Payer: Medicare HMO | Admitting: Podiatry

## 2020-08-21 DIAGNOSIS — S86012S Strain of left Achilles tendon, sequela: Secondary | ICD-10-CM

## 2020-08-21 DIAGNOSIS — Z9889 Other specified postprocedural states: Secondary | ICD-10-CM

## 2020-08-21 DIAGNOSIS — S86012D Strain of left Achilles tendon, subsequent encounter: Secondary | ICD-10-CM

## 2020-08-21 DIAGNOSIS — M722 Plantar fascial fibromatosis: Secondary | ICD-10-CM

## 2020-08-21 NOTE — Progress Notes (Signed)
  Subjective:  Patient ID: Anna Mclaughlin, female    DOB: 11-May-1955,  MRN: [id number]  Chief Complaint  Patient presents with   Routine Post Op    The staples are not my friend today and they are irritating me on the left foot     DOS: 08/04/20 Procedure:  S/P Left achilles tendon repair and EPF  65 y.o. female presents with the above complaint. History confirmed with patient. Pain overall controlled, not taking pain medication. Reports irritation from her stitches/staples. Denies N/V/F/Ch or other post-op concerns. Has been NWB as directed. Splint clean without signs of wear.  Objective:  Physical Exam: tenderness at the surgical site, local edema noted, and calf supple, nontender. Incision: healing well, no significant drainage, no dehiscence, no significant erythema  Assessment:   1. Partial Achilles tendon tear, left, sequela   2. Plantar fasciitis   3. S/P foot surgery, left     Plan:  Patient was evaluated and treated and all questions answered.  Post-operative State -Partial suture removal today. Proximal sutures at Achilles removed. Several left intact distally. Medial heel sutures left intact. -Dry sterile dressing and splint reapplied. -No need for pain med refill today. -NWB LLE -XRs needed at follow-up: none   No follow-ups on file.

## 2020-08-29 ENCOUNTER — Other Ambulatory Visit: Payer: Self-pay

## 2020-08-29 ENCOUNTER — Ambulatory Visit (INDEPENDENT_AMBULATORY_CARE_PROVIDER_SITE_OTHER): Payer: Medicare HMO | Admitting: Sports Medicine

## 2020-08-29 ENCOUNTER — Encounter: Payer: Self-pay | Admitting: Sports Medicine

## 2020-08-29 DIAGNOSIS — F5101 Primary insomnia: Secondary | ICD-10-CM | POA: Diagnosis not present

## 2020-08-29 DIAGNOSIS — S86012D Strain of left Achilles tendon, subsequent encounter: Secondary | ICD-10-CM

## 2020-08-29 DIAGNOSIS — F411 Generalized anxiety disorder: Secondary | ICD-10-CM | POA: Diagnosis not present

## 2020-08-29 DIAGNOSIS — M79672 Pain in left foot: Secondary | ICD-10-CM

## 2020-08-29 DIAGNOSIS — R69 Illness, unspecified: Secondary | ICD-10-CM | POA: Diagnosis not present

## 2020-08-29 DIAGNOSIS — Z9889 Other specified postprocedural states: Secondary | ICD-10-CM

## 2020-08-29 DIAGNOSIS — M722 Plantar fascial fibromatosis: Secondary | ICD-10-CM

## 2020-08-29 DIAGNOSIS — M216X2 Other acquired deformities of left foot: Secondary | ICD-10-CM

## 2020-08-29 DIAGNOSIS — S86012S Strain of left Achilles tendon, sequela: Secondary | ICD-10-CM

## 2020-08-29 DIAGNOSIS — Z79899 Other long term (current) drug therapy: Secondary | ICD-10-CM | POA: Diagnosis not present

## 2020-08-29 DIAGNOSIS — M779 Enthesopathy, unspecified: Secondary | ICD-10-CM

## 2020-08-29 MED ORDER — DOCUSATE SODIUM 100 MG PO CAPS: 100.0000 mg | ORAL_CAPSULE | Freq: Two times a day (BID) | ORAL | 0 refills | Status: AC

## 2020-08-29 NOTE — Progress Notes (Signed)
Subjective: Anna Mclaughlin is a 65 y.o. female patient seen today in office for POV #3 DOS 08-04-20), S/P Left achilles tendon repair and EPF. Patient denies pain at surgical site, tired of being off the foot and sitting around, denies calf pain, denies headache, chest pain, shortness of breath, nausea, vomiting, fever, or chills.  No calf pain. No other issues noted.   Patient Active Problem List   Diagnosis Date Noted   Haglund's deformity of left heel 07/11/2020   Abnormality of left breast on screening mammogram 05/12/2020   Fatigue 03/07/2020   Prediabetes 08/17/2019   Family history of malignant neoplasm of digestive organs 06/13/2019   Chronic right-sided thoracic back pain 11/07/2018   Myalgia 03/02/2018   Varicose veins of right lower extremity with pain 11/16/2017   Primary osteoarthritis involving multiple joints 05/26/2017   Obesity (BMI 30-39.9) 10/28/2015   Degeneration of lumbar intervertebral disc 05/13/2015   Essential hypertension 05/13/2015   Gastroesophageal reflux disease without esophagitis 05/13/2015   Hearing loss 05/13/2015   High risk medication use 05/13/2015   Malaise and fatigue 05/13/2015   Menopausal disorder 05/13/2015   Mixed hyperlipidemia 05/13/2015   Vitamin D deficiency 05/13/2015   Generalized anxiety disorder 04/24/2013   Moderate episode of recurrent major depressive disorder (HCC) 04/24/2013   Routine history and physical examination of adult 09/14/2012   Meniere's disease 07/22/2012    Current Outpatient Medications on File Prior to Visit  Medication Sig Dispense Refill   Armodafinil 150 MG tablet Take 1/2 to 1 a day     citalopram (CELEXA) 20 MG tablet Take 1 a day     diclofenac (VOLTAREN) 75 MG EC tablet Take 1 tablet (75 mg total) by mouth 2 (two) times daily. 30 tablet 0   DULoxetine (CYMBALTA) 60 MG capsule Take by mouth.     gabapentin (NEURONTIN) 300 MG capsule      gemfibrozil (LOPID) 600 MG tablet TAKE 1 TABLET BY MOUTH TWICE  (2) DAILY     omeprazole (PRILOSEC) 20 MG capsule Take 20 mg by mouth daily.     omeprazole (PRILOSEC) 40 MG capsule Take 40 mg by mouth daily.     piroxicam (FELDENE) 20 MG capsule Take 20 mg by mouth daily.     predniSONE (STERAPRED UNI-PAK 21 TAB) 10 MG (21) TBPK tablet Take as directed 21 tablet 0   promethazine (PHENERGAN) 25 MG tablet Take 1 tablet (25 mg total) by mouth every 8 (eight) hours as needed for nausea or vomiting. 20 tablet 0   quinapril-hydrochlorothiazide (ACCURETIC) 20-25 MG tablet      rivaroxaban (XARELTO) 10 MG TABS tablet Take 1 tablet (10 mg total) by mouth daily. 30 tablet 0   tiZANidine (ZANAFLEX) 4 MG tablet Take by mouth.     traMADol (ULTRAM) 50 MG tablet      traZODone (DESYREL) 100 MG tablet Take 100 mg by mouth at bedtime.     No current facility-administered medications on file prior to visit.    Allergies  Allergen Reactions   Sulfa Antibiotics Hives   Penicillins Other (See Comments) and Rash    Objective: There were no vitals filed for this visit.  General: No acute distress, AAOx3  Left foot: Sutures and staples intact with no gapping or dehiscence at surgical sites, mild swelling to left foot, no erythema, no warmth, no drainage, no signs of infection noted, Capillary fill time <3 seconds in all digits, gross sensation present via light touch to left foot. No pain  or crepitation with range of motion left foot.  No pain with calf compression.   Assessment and Plan:  Problem List Items Addressed This Visit   None Visit Diagnoses     Partial Achilles tendon tear, left, sequela    -  Primary   S/P foot surgery, left       Relevant Medications   docusate sodium (COLACE) 100 MG capsule   Plantar fasciitis       Bone spur       Pain of left heel       Acquired equinus deformity of left foot            -Patient seen and evaluated -Sutures removed at posterior heel but left intact medially  -Staples left intact -Applied purple fiberglass  below the knee cast to left lower extremity  -Advised patient to make sure to keep cast clean, dry, and intact to left surgical site, refrain from sticking things down into the cast -Advised patient to continue with nonweightbearing with use of crutches/scooter -Continue with Xarelto until completed -Advised patient to limit activity to necessity  -Advised patient to ice and elevate as instructed -Will plan for cast change and xrays at next office visit. In the meantime, patient to call office if any issues or problems arise.   [name], DPM

## 2020-09-03 ENCOUNTER — Encounter: Payer: Medicare HMO | Admitting: Sports Medicine

## 2020-09-05 DIAGNOSIS — E559 Vitamin D deficiency, unspecified: Secondary | ICD-10-CM | POA: Diagnosis not present

## 2020-09-05 DIAGNOSIS — R5383 Other fatigue: Secondary | ICD-10-CM | POA: Diagnosis not present

## 2020-09-05 DIAGNOSIS — Z0000 Encounter for general adult medical examination without abnormal findings: Secondary | ICD-10-CM | POA: Diagnosis not present

## 2020-09-05 DIAGNOSIS — R69 Illness, unspecified: Secondary | ICD-10-CM | POA: Diagnosis not present

## 2020-09-05 DIAGNOSIS — E782 Mixed hyperlipidemia: Secondary | ICD-10-CM | POA: Diagnosis not present

## 2020-09-05 DIAGNOSIS — H918X1 Other specified hearing loss, right ear: Secondary | ICD-10-CM | POA: Diagnosis not present

## 2020-09-05 DIAGNOSIS — R5381 Other malaise: Secondary | ICD-10-CM | POA: Diagnosis not present

## 2020-09-05 DIAGNOSIS — I83811 Varicose veins of right lower extremities with pain: Secondary | ICD-10-CM | POA: Diagnosis not present

## 2020-09-05 DIAGNOSIS — I10 Essential (primary) hypertension: Secondary | ICD-10-CM | POA: Diagnosis not present

## 2020-09-05 DIAGNOSIS — K219 Gastro-esophageal reflux disease without esophagitis: Secondary | ICD-10-CM | POA: Diagnosis not present

## 2020-09-05 DIAGNOSIS — R7303 Prediabetes: Secondary | ICD-10-CM | POA: Diagnosis not present

## 2020-09-05 DIAGNOSIS — M159 Polyosteoarthritis, unspecified: Secondary | ICD-10-CM | POA: Diagnosis not present

## 2020-09-05 DIAGNOSIS — Z79899 Other long term (current) drug therapy: Secondary | ICD-10-CM | POA: Diagnosis not present

## 2020-09-12 ENCOUNTER — Ambulatory Visit (INDEPENDENT_AMBULATORY_CARE_PROVIDER_SITE_OTHER): Payer: Medicare HMO | Admitting: Sports Medicine

## 2020-09-12 ENCOUNTER — Encounter: Payer: Self-pay | Admitting: Sports Medicine

## 2020-09-12 ENCOUNTER — Other Ambulatory Visit: Payer: Self-pay

## 2020-09-12 DIAGNOSIS — M722 Plantar fascial fibromatosis: Secondary | ICD-10-CM | POA: Diagnosis not present

## 2020-09-12 DIAGNOSIS — M79672 Pain in left foot: Secondary | ICD-10-CM

## 2020-09-12 DIAGNOSIS — M779 Enthesopathy, unspecified: Secondary | ICD-10-CM

## 2020-09-12 DIAGNOSIS — S86012S Strain of left Achilles tendon, sequela: Secondary | ICD-10-CM

## 2020-09-12 DIAGNOSIS — Z9889 Other specified postprocedural states: Secondary | ICD-10-CM | POA: Diagnosis not present

## 2020-09-12 NOTE — Progress Notes (Signed)
Subjective: Anna Mclaughlin is a 65 y.o. female patient seen today in office for POV # 4 DOS 08-04-20), S/P Left achilles tendon repair and EPF. Patient denies pain at surgical site, patient reports that she is ready to walk, denies calf pain, denies headache, chest pain, shortness of breath, nausea, vomiting, fever, or chills.  No calf pain. No other issues noted.   Patient Active Problem List   Diagnosis Date Noted   Haglund's deformity of left heel 07/11/2020   Abnormality of left breast on screening mammogram 05/12/2020   Fatigue 03/07/2020   Prediabetes 08/17/2019   Family history of malignant neoplasm of digestive organs 06/13/2019   Chronic right-sided thoracic back pain 11/07/2018   Myalgia 03/02/2018   Varicose veins of right lower extremity with pain 11/16/2017   Primary osteoarthritis involving multiple joints 05/26/2017   Obesity (BMI 30-39.9) 10/28/2015   Degeneration of lumbar intervertebral disc 05/13/2015   Essential hypertension 05/13/2015   Gastroesophageal reflux disease without esophagitis 05/13/2015   Hearing loss 05/13/2015   High risk medication use 05/13/2015   Malaise and fatigue 05/13/2015   Menopausal disorder 05/13/2015   Mixed hyperlipidemia 05/13/2015   Vitamin D deficiency 05/13/2015   Generalized anxiety disorder 04/24/2013   Moderate episode of recurrent major depressive disorder (HCC) 04/24/2013   Routine history and physical examination of adult 09/14/2012   Meniere's disease 07/22/2012    Current Outpatient Medications on File Prior to Visit  Medication Sig Dispense Refill   Armodafinil 150 MG tablet Take 1/2 to 1 a day     citalopram (CELEXA) 20 MG tablet Take 1 a day     diclofenac (VOLTAREN) 75 MG EC tablet Take 1 tablet (75 mg total) by mouth 2 (two) times daily. 30 tablet 0   docusate sodium (COLACE) 100 MG capsule Take 1 capsule (100 mg total) by mouth 2 (two) times daily. 10 capsule 0   DULoxetine (CYMBALTA) 60 MG capsule Take by mouth.      gabapentin (NEURONTIN) 300 MG capsule      gemfibrozil (LOPID) 600 MG tablet TAKE 1 TABLET BY MOUTH TWICE (2) DAILY     omeprazole (PRILOSEC) 20 MG capsule Take 20 mg by mouth daily.     omeprazole (PRILOSEC) 40 MG capsule Take 40 mg by mouth daily.     piroxicam (FELDENE) 20 MG capsule Take 20 mg by mouth daily.     predniSONE (STERAPRED UNI-PAK 21 TAB) 10 MG (21) TBPK tablet Take as directed 21 tablet 0   promethazine (PHENERGAN) 25 MG tablet Take 1 tablet (25 mg total) by mouth every 8 (eight) hours as needed for nausea or vomiting. 20 tablet 0   quinapril-hydrochlorothiazide (ACCURETIC) 20-25 MG tablet      rivaroxaban (XARELTO) 10 MG TABS tablet Take 1 tablet (10 mg total) by mouth daily. 30 tablet 0   tiZANidine (ZANAFLEX) 4 MG tablet Take by mouth.     traMADol (ULTRAM) 50 MG tablet      traZODone (DESYREL) 100 MG tablet Take 100 mg by mouth at bedtime.     No current facility-administered medications on file prior to visit.    Allergies  Allergen Reactions   Sulfa Antibiotics Hives   Penicillins Other (See Comments) and Rash    Objective: There were no vitals filed for this visit.  General: No acute distress, AAOx3  Left foot: Remaining staples intact with no gapping or dehiscence at surgical sites, mild swelling to left foot, no erythema, no warmth, no drainage, no signs  of infection noted, Capillary fill time <3 seconds in all digits, gross sensation present via light touch to left foot. No pain or crepitation with range of motion left foot.  No pain with calf compression.   Assessment and Plan:  Problem List Items Addressed This Visit   None Visit Diagnoses     S/P foot surgery, left    -  Primary   Partial Achilles tendon tear, left, sequela       Plantar fasciitis       Bone spur       Pain of left heel            -Patient seen and evaluated -A few staples removed -Applied red fiberglass below the knee cast to left lower extremity  -Advised patient to make  sure to keep cast clean, dry, and intact to left surgical site, refrain from sticking things down into the cast like before -Advised patient to continue with nonweightbearing with use of crutches/scooter -Continue with Xarelto until completed -Advised patient to limit activity to necessity  -Advised patient to ice and elevate as instructed -Will plan for cast change and xrays and remove remaining staples at next office visit. In the meantime, patient to call office if any issues or problems arise.   [name], DPM

## 2020-09-26 ENCOUNTER — Encounter: Payer: Self-pay | Admitting: Sports Medicine

## 2020-09-26 ENCOUNTER — Ambulatory Visit (INDEPENDENT_AMBULATORY_CARE_PROVIDER_SITE_OTHER): Payer: Medicare HMO | Admitting: Sports Medicine

## 2020-09-26 ENCOUNTER — Other Ambulatory Visit: Payer: Self-pay

## 2020-09-26 ENCOUNTER — Ambulatory Visit (INDEPENDENT_AMBULATORY_CARE_PROVIDER_SITE_OTHER): Payer: Medicare HMO

## 2020-09-26 DIAGNOSIS — M79672 Pain in left foot: Secondary | ICD-10-CM

## 2020-09-26 DIAGNOSIS — S86012S Strain of left Achilles tendon, sequela: Secondary | ICD-10-CM | POA: Diagnosis not present

## 2020-09-26 DIAGNOSIS — M216X2 Other acquired deformities of left foot: Secondary | ICD-10-CM

## 2020-09-26 DIAGNOSIS — M722 Plantar fascial fibromatosis: Secondary | ICD-10-CM

## 2020-09-26 DIAGNOSIS — M779 Enthesopathy, unspecified: Secondary | ICD-10-CM

## 2020-09-26 DIAGNOSIS — Z9889 Other specified postprocedural states: Secondary | ICD-10-CM

## 2020-09-26 NOTE — Progress Notes (Signed)
Subjective: Anna Mclaughlin is a 65 y.o. female patient seen today in office for POV # 5 DOS 08-04-20), S/P Left achilles tendon repair and EPF. Patient denies pain at surgical site, patient reports that she is ready to walk and have the rest of the staples out, denies calf pain, denies headache, chest pain, shortness of breath, nausea, vomiting, fever, or chills.  No calf pain. No other issues noted.   Patient Active Problem List   Diagnosis Date Noted   Haglund's deformity of left heel 07/11/2020   Abnormality of left breast on screening mammogram 05/12/2020   Fatigue 03/07/2020   Prediabetes 08/17/2019   Family history of malignant neoplasm of digestive organs 06/13/2019   Chronic right-sided thoracic back pain 11/07/2018   Myalgia 03/02/2018   Varicose veins of right lower extremity with pain 11/16/2017   Primary osteoarthritis involving multiple joints 05/26/2017   Obesity (BMI 30-39.9) 10/28/2015   Degeneration of lumbar intervertebral disc 05/13/2015   Essential hypertension 05/13/2015   Gastroesophageal reflux disease without esophagitis 05/13/2015   Hearing loss 05/13/2015   High risk medication use 05/13/2015   Malaise and fatigue 05/13/2015   Menopausal disorder 05/13/2015   Mixed hyperlipidemia 05/13/2015   Vitamin D deficiency 05/13/2015   Generalized anxiety disorder 04/24/2013   Moderate episode of recurrent major depressive disorder (HCC) 04/24/2013   Routine history and physical examination of adult 09/14/2012   Meniere's disease 07/22/2012    Current Outpatient Medications on File Prior to Visit  Medication Sig Dispense Refill   Armodafinil 150 MG tablet Take 1/2 to 1 a day     citalopram (CELEXA) 20 MG tablet Take 1 a day     diclofenac (VOLTAREN) 75 MG EC tablet Take 1 tablet (75 mg total) by mouth 2 (two) times daily. 30 tablet 0   docusate sodium (COLACE) 100 MG capsule Take 1 capsule (100 mg total) by mouth 2 (two) times daily. 10 capsule 0   DULoxetine  (CYMBALTA) 60 MG capsule Take by mouth.     gabapentin (NEURONTIN) 300 MG capsule      gemfibrozil (LOPID) 600 MG tablet TAKE 1 TABLET BY MOUTH TWICE (2) DAILY     omeprazole (PRILOSEC) 20 MG capsule Take 20 mg by mouth daily.     omeprazole (PRILOSEC) 40 MG capsule Take 40 mg by mouth daily.     piroxicam (FELDENE) 20 MG capsule Take 20 mg by mouth daily.     predniSONE (STERAPRED UNI-PAK 21 TAB) 10 MG (21) TBPK tablet Take as directed 21 tablet 0   promethazine (PHENERGAN) 25 MG tablet Take 1 tablet (25 mg total) by mouth every 8 (eight) hours as needed for nausea or vomiting. 20 tablet 0   quinapril-hydrochlorothiazide (ACCURETIC) 20-25 MG tablet      rivaroxaban (XARELTO) 10 MG TABS tablet Take 1 tablet (10 mg total) by mouth daily. 30 tablet 0   tiZANidine (ZANAFLEX) 4 MG tablet Take by mouth.     traMADol (ULTRAM) 50 MG tablet      traZODone (DESYREL) 100 MG tablet Take 100 mg by mouth at bedtime.     No current facility-administered medications on file prior to visit.    Allergies  Allergen Reactions   Sulfa Antibiotics Hives   Penicillins Other (See Comments) and Rash    Objective: There were no vitals filed for this visit.  General: No acute distress, AAOx3  Left foot: Remaining staples intact with no gapping or dehiscence at surgical sites, mild swelling to left foot,  no erythema, no warmth, no drainage, no signs of infection noted, Capillary fill time <3 seconds in all digits, gross sensation present via light touch to left foot. No pain or crepitation with range of motion left foot.  No pain with calf compression.   Assessment and Plan:  Problem List Items Addressed This Visit   None Visit Diagnoses     Partial Achilles tendon tear, left, sequela    -  Primary   Relevant Orders   DG Foot Complete Left (Completed)   S/P foot surgery, left       Plantar fasciitis       Bone spur       Pain of left heel       Acquired equinus deformity of left foot             -Patient seen and evaluated -X-rays consistent with postoperative state -Remaining staples removed -Applied stockinette and advised patient that she may shower on tomorrow and allow dead skin to fall off on its own may use antibiotic cream to the incision twice a week -Patient may now weight-bear with surgical shoe states she does not want to use the cam boot because she does not like how heavy it is; advised patient to use cane when attempting to ambulate -Advised patient to limit activity to necessity  -Advised patient to ice and elevate as instructed -Will plan for postoperative check and repeat x-rays at next visit. In the meantime, patient to call office if any issues or problems arise.   [name], DPM

## 2020-09-29 ENCOUNTER — Telehealth: Payer: Self-pay

## 2020-09-29 NOTE — Telephone Encounter (Signed)
Pt is experiencing a lot of pain. She cant walk on her foot and its hurting really bad. She would like to know if she needs to come in for an appointment.

## 2020-09-30 NOTE — Telephone Encounter (Signed)
Called pt to advised her that per Dr. Marylene Land she should still use her knee scooter or try to use a walker that's normal to have pain at 1st because she has been nonweightbearing and casted for so long. And if she is still struggling we can add on Physical therapy.  Pt states her most concern is her swelling at the bottom of the heel radiating up the leg (no redness). Pt states she can't stand for too long and she has been doing PT stretchings at home with no help and her Rt foot has started to bother her as well and she sees like her veins are busting or popping out.

## 2020-10-01 NOTE — Telephone Encounter (Signed)
Contacted pt and Relayed Dr. Wynema Birch message and  Dr. Nolon Stalls PT . Pt declined PT, she states she is having a lot of pain and she already knows PT wound do and no one would be touching her foot the way it feels.

## 2020-10-07 ENCOUNTER — Telehealth: Payer: Self-pay | Admitting: Sports Medicine

## 2020-10-07 NOTE — Telephone Encounter (Signed)
Pt states when her feet are swelling her skin is breaking open & bleeding on foot that you did surgery on.  Pt has POV 10-21-20-pls advise.

## 2020-10-08 NOTE — Telephone Encounter (Signed)
Contacted pt to relay Dr. Wynema Birch suggestions. Pt stated understanding

## 2020-10-21 ENCOUNTER — Ambulatory Visit (INDEPENDENT_AMBULATORY_CARE_PROVIDER_SITE_OTHER): Payer: Medicare HMO

## 2020-10-21 ENCOUNTER — Ambulatory Visit (INDEPENDENT_AMBULATORY_CARE_PROVIDER_SITE_OTHER): Payer: Medicare HMO | Admitting: Sports Medicine

## 2020-10-21 ENCOUNTER — Encounter: Payer: Self-pay | Admitting: Sports Medicine

## 2020-10-21 ENCOUNTER — Other Ambulatory Visit: Payer: Self-pay

## 2020-10-21 DIAGNOSIS — S86012S Strain of left Achilles tendon, sequela: Secondary | ICD-10-CM | POA: Diagnosis not present

## 2020-10-21 DIAGNOSIS — M79672 Pain in left foot: Secondary | ICD-10-CM

## 2020-10-21 DIAGNOSIS — M779 Enthesopathy, unspecified: Secondary | ICD-10-CM

## 2020-10-21 DIAGNOSIS — M216X2 Other acquired deformities of left foot: Secondary | ICD-10-CM

## 2020-10-21 DIAGNOSIS — Z9889 Other specified postprocedural states: Secondary | ICD-10-CM

## 2020-10-21 DIAGNOSIS — M722 Plantar fascial fibromatosis: Secondary | ICD-10-CM

## 2020-10-21 DIAGNOSIS — M7662 Achilles tendinitis, left leg: Secondary | ICD-10-CM

## 2020-10-21 MED ORDER — DICLOFENAC SODIUM 75 MG PO TBEC: 75.0000 mg | DELAYED_RELEASE_TABLET | Freq: Two times a day (BID) | ORAL | 0 refills | Status: DC

## 2020-10-21 NOTE — Progress Notes (Signed)
Subjective: Anna Mclaughlin is a 65 y.o. female patient seen today in office for POV # 6 DOS 08-04-20), S/P Left achilles tendon repair and EPF. Patient admits pain to the lateral heel and swelling states that the pain is worse the more that she is up on her foot.  Patient also admits peeling and bleeding skin at the back of the heel has been applying antibiotic ointment and soaking.  Denies calf pain, denies headache, chest pain, shortness of breath, nausea, vomiting, fever, or chills.  No calf pain. No other issues noted.   Patient Active Problem List   Diagnosis Date Noted   Haglund's deformity of left heel 07/11/2020   Abnormality of left breast on screening mammogram 05/12/2020   Fatigue 03/07/2020   Prediabetes 08/17/2019   Family history of malignant neoplasm of digestive organs 06/13/2019   Chronic right-sided thoracic back pain 11/07/2018   Myalgia 03/02/2018   Varicose veins of right lower extremity with pain 11/16/2017   Primary osteoarthritis involving multiple joints 05/26/2017   Obesity (BMI 30-39.9) 10/28/2015   Degeneration of lumbar intervertebral disc 05/13/2015   Essential hypertension 05/13/2015   Gastroesophageal reflux disease without esophagitis 05/13/2015   Hearing loss 05/13/2015   High risk medication use 05/13/2015   Malaise and fatigue 05/13/2015   Menopausal disorder 05/13/2015   Mixed hyperlipidemia 05/13/2015   Vitamin D deficiency 05/13/2015   Generalized anxiety disorder 04/24/2013   Moderate episode of recurrent major depressive disorder (HCC) 04/24/2013   Routine history and physical examination of adult 09/14/2012   Meniere's disease 07/22/2012    Current Outpatient Medications on File Prior to Visit  Medication Sig Dispense Refill   Armodafinil 150 MG tablet Take 1/2 to 1 a day     citalopram (CELEXA) 20 MG tablet Take 1 a day     docusate sodium (COLACE) 100 MG capsule Take 1 capsule (100 mg total) by mouth 2 (two) times daily. 10 capsule 0    DULoxetine (CYMBALTA) 60 MG capsule Take by mouth.     gabapentin (NEURONTIN) 300 MG capsule      gemfibrozil (LOPID) 600 MG tablet TAKE 1 TABLET BY MOUTH TWICE (2) DAILY     omeprazole (PRILOSEC) 20 MG capsule Take 20 mg by mouth daily.     omeprazole (PRILOSEC) 40 MG capsule Take 40 mg by mouth daily.     piroxicam (FELDENE) 20 MG capsule Take 20 mg by mouth daily.     predniSONE (STERAPRED UNI-PAK 21 TAB) 10 MG (21) TBPK tablet Take as directed 21 tablet 0   promethazine (PHENERGAN) 25 MG tablet Take 1 tablet (25 mg total) by mouth every 8 (eight) hours as needed for nausea or vomiting. 20 tablet 0   quinapril-hydrochlorothiazide (ACCURETIC) 20-25 MG tablet      rivaroxaban (XARELTO) 10 MG TABS tablet Take 1 tablet (10 mg total) by mouth daily. 30 tablet 0   tiZANidine (ZANAFLEX) 4 MG tablet Take by mouth.     traMADol (ULTRAM) 50 MG tablet      traZODone (DESYREL) 100 MG tablet Take 100 mg by mouth at bedtime.     No current facility-administered medications on file prior to visit.    Allergies  Allergen Reactions   Sulfa Antibiotics Hives   Penicillins Other (See Comments) and Rash    Objective: There were no vitals filed for this visit.  General: No acute distress, AAOx3 except crying during this visit patient reports that she has a lot of stress going on right now  Left foot: Gapping at the posterior heel less than 0.2cm with a granular base, mild swelling to left foot and posterior heel lateral aspect, no erythema, no warmth, no drainage, no signs of infection noted, Capillary fill time <3 seconds in all digits, gross sensation present via light touch to left foot. No pain or crepitation with range of motion left foot.  No pain with calf compression.   X-rays consistent with postoperative status with multiple areas of calcification along the Achilles tendon  Assessment and Plan:  Problem List Items Addressed This Visit   None Visit Diagnoses     Partial Achilles tendon  tear, left, sequela    -  Primary   Relevant Orders   DG Foot Complete Left   S/P foot surgery, left       Plantar fasciitis       Bone spur       Pain of left heel       Acquired equinus deformity of left foot       Tendonitis, Achilles, left            -Patient seen and evaluated -X-rays consistent with postoperative state -Applied Betadine to the left heel and advised patient to do the same daily -Applied stockinette and advised patient to wear to assist with edema control -Advised patient if pain is worsening she she will return to using her cam boot -Advised patient to limit activity to necessity  -Advised patient to ice and elevate as instructed -Prescribed diclofenac for patient to take as instructed for pain and inflammation since at this time patient refuses steroid -Will plan for postoperative check at next visit. In the meantime, patient to call office if any issues or problems arise.  We will discuss return to work versus adding on physical therapy depending on how patient is doing next visit.  [name], DPM

## 2020-10-27 ENCOUNTER — Telehealth: Payer: Self-pay

## 2020-10-27 NOTE — Telephone Encounter (Signed)
Pt called asking if Dr. Marylene Land can set her up with PT so she can get ready to go back to work. Please advice

## 2020-10-28 DIAGNOSIS — R531 Weakness: Secondary | ICD-10-CM | POA: Diagnosis not present

## 2020-10-28 DIAGNOSIS — M25572 Pain in left ankle and joints of left foot: Secondary | ICD-10-CM | POA: Diagnosis not present

## 2020-10-28 DIAGNOSIS — M25672 Stiffness of left ankle, not elsewhere classified: Secondary | ICD-10-CM | POA: Diagnosis not present

## 2020-10-31 ENCOUNTER — Ambulatory Visit (INDEPENDENT_AMBULATORY_CARE_PROVIDER_SITE_OTHER): Payer: Medicare HMO

## 2020-10-31 ENCOUNTER — Other Ambulatory Visit: Payer: Self-pay

## 2020-10-31 ENCOUNTER — Encounter: Payer: Self-pay | Admitting: Sports Medicine

## 2020-10-31 ENCOUNTER — Ambulatory Visit (INDEPENDENT_AMBULATORY_CARE_PROVIDER_SITE_OTHER): Payer: Medicare HMO | Admitting: Sports Medicine

## 2020-10-31 DIAGNOSIS — M722 Plantar fascial fibromatosis: Secondary | ICD-10-CM

## 2020-10-31 DIAGNOSIS — Z9889 Other specified postprocedural states: Secondary | ICD-10-CM

## 2020-10-31 DIAGNOSIS — S86012D Strain of left Achilles tendon, subsequent encounter: Secondary | ICD-10-CM

## 2020-10-31 DIAGNOSIS — M216X2 Other acquired deformities of left foot: Secondary | ICD-10-CM

## 2020-10-31 DIAGNOSIS — M79672 Pain in left foot: Secondary | ICD-10-CM

## 2020-10-31 DIAGNOSIS — M779 Enthesopathy, unspecified: Secondary | ICD-10-CM

## 2020-10-31 DIAGNOSIS — S86012S Strain of left Achilles tendon, sequela: Secondary | ICD-10-CM

## 2020-10-31 NOTE — Progress Notes (Signed)
Subjective: Anna Mclaughlin is a 65 y.o. female patient seen today in office for POV # 7 DOS 08-04-20), S/P Left achilles tendon repair and EPF. Patient reports that she is doing okay still has some swelling and pain left lateral heel and states that she does not like wearing the boot.  Patient did go for physical therapy for 1 session so far.  Denies any other constitutional symptoms at this time.  Patient Active Problem List   Diagnosis Date Noted   Haglund's deformity of left heel 07/11/2020   Abnormality of left breast on screening mammogram 05/12/2020   Fatigue 03/07/2020   Prediabetes 08/17/2019   Family history of malignant neoplasm of digestive organs 06/13/2019   Chronic right-sided thoracic back pain 11/07/2018   Myalgia 03/02/2018   Varicose veins of right lower extremity with pain 11/16/2017   Primary osteoarthritis involving multiple joints 05/26/2017   Obesity (BMI 30-39.9) 10/28/2015   Degeneration of lumbar intervertebral disc 05/13/2015   Essential hypertension 05/13/2015   Gastroesophageal reflux disease without esophagitis 05/13/2015   Hearing loss 05/13/2015   High risk medication use 05/13/2015   Malaise and fatigue 05/13/2015   Menopausal disorder 05/13/2015   Mixed hyperlipidemia 05/13/2015   Vitamin D deficiency 05/13/2015   Generalized anxiety disorder 04/24/2013   Moderate episode of recurrent major depressive disorder (HCC) 04/24/2013   Routine history and physical examination of adult 09/14/2012   Meniere's disease 07/22/2012    Current Outpatient Medications on File Prior to Visit  Medication Sig Dispense Refill   Armodafinil 150 MG tablet Take 1/2 to 1 a day     citalopram (CELEXA) 20 MG tablet Take 1 a day     diclofenac (VOLTAREN) 75 MG EC tablet Take 1 tablet (75 mg total) by mouth 2 (two) times daily. 30 tablet 0   docusate sodium (COLACE) 100 MG capsule Take 1 capsule (100 mg total) by mouth 2 (two) times daily. 10 capsule 0   DULoxetine  (CYMBALTA) 60 MG capsule Take by mouth.     gabapentin (NEURONTIN) 300 MG capsule      gemfibrozil (LOPID) 600 MG tablet TAKE 1 TABLET BY MOUTH TWICE (2) DAILY     omeprazole (PRILOSEC) 20 MG capsule Take 20 mg by mouth daily.     omeprazole (PRILOSEC) 40 MG capsule Take 40 mg by mouth daily.     piroxicam (FELDENE) 20 MG capsule Take 20 mg by mouth daily.     predniSONE (STERAPRED UNI-PAK 21 TAB) 10 MG (21) TBPK tablet Take as directed 21 tablet 0   promethazine (PHENERGAN) 25 MG tablet Take 1 tablet (25 mg total) by mouth every 8 (eight) hours as needed for nausea or vomiting. 20 tablet 0   quinapril-hydrochlorothiazide (ACCURETIC) 20-25 MG tablet      rivaroxaban (XARELTO) 10 MG TABS tablet Take 1 tablet (10 mg total) by mouth daily. 30 tablet 0   tiZANidine (ZANAFLEX) 4 MG tablet Take by mouth.     traMADol (ULTRAM) 50 MG tablet      traZODone (DESYREL) 100 MG tablet Take 100 mg by mouth at bedtime.     No current facility-administered medications on file prior to visit.    Allergies  Allergen Reactions   Sulfa Antibiotics Hives   Penicillins Other (See Comments) and Rash    Objective: There were no vitals filed for this visit.  General: No acute distress, AAOx3   Left foot: Gapping at the posterior heel less than 0.1cm with a granular base, mild swelling  to left foot and posterior heel lateral aspect, no erythema, no warmth, no drainage, no signs of infection noted, Capillary fill time <3 seconds in all digits, gross sensation present via light touch to left foot.  Pain to direct palpation to the posterior heel lateral aspect.  No pain with calf compression.   X-rays consistent with postoperative status with multiple areas of calcification along the Achilles tendon  Assessment and Plan:  Problem List Items Addressed This Visit   None Visit Diagnoses     Partial Achilles tendon tear, left, sequela    -  Primary   Relevant Orders   DG Foot Complete Left   S/P foot surgery,  left       Plantar fasciitis       Bone spur       Pain of left heel       Acquired equinus deformity of left foot            -Patient seen and evaluated -X-rays consistent with postoperative state -Advised patient to continue with Betadine as directed at bedtime to the heel and only use Vaseline or antibiotic ointment to the dry areas as needed  -Continue with compression/support.  For edema control -Advised patient to use cam boot if this boot is bothering her leg she may switch to surgical shoe and should use this at least for the next 2 weeks and allow physical therapy to slowly transition her out of her shoe and boot as tolerated -Advised patient to limit activity to necessity  -Advised patient to ice and elevate as instructed -Continue with diclofenac as needed for pain and inflammation -Will plan for postoperative check and follow-up to see how patient is doing with more therapy next visit. [name], DPM

## 2020-11-04 DIAGNOSIS — M25572 Pain in left ankle and joints of left foot: Secondary | ICD-10-CM | POA: Diagnosis not present

## 2020-11-04 DIAGNOSIS — M25672 Stiffness of left ankle, not elsewhere classified: Secondary | ICD-10-CM | POA: Diagnosis not present

## 2020-11-04 DIAGNOSIS — R531 Weakness: Secondary | ICD-10-CM | POA: Diagnosis not present

## 2020-11-11 DIAGNOSIS — M25672 Stiffness of left ankle, not elsewhere classified: Secondary | ICD-10-CM | POA: Diagnosis not present

## 2020-11-11 DIAGNOSIS — M25572 Pain in left ankle and joints of left foot: Secondary | ICD-10-CM | POA: Diagnosis not present

## 2020-11-11 DIAGNOSIS — R531 Weakness: Secondary | ICD-10-CM | POA: Diagnosis not present

## 2020-11-18 DIAGNOSIS — M25672 Stiffness of left ankle, not elsewhere classified: Secondary | ICD-10-CM | POA: Diagnosis not present

## 2020-11-18 DIAGNOSIS — M25572 Pain in left ankle and joints of left foot: Secondary | ICD-10-CM | POA: Diagnosis not present

## 2020-11-18 DIAGNOSIS — R531 Weakness: Secondary | ICD-10-CM | POA: Diagnosis not present

## 2020-11-19 ENCOUNTER — Encounter: Payer: Self-pay | Admitting: Sports Medicine

## 2020-11-19 ENCOUNTER — Ambulatory Visit (INDEPENDENT_AMBULATORY_CARE_PROVIDER_SITE_OTHER): Payer: Medicare HMO | Admitting: Sports Medicine

## 2020-11-19 ENCOUNTER — Other Ambulatory Visit: Payer: Self-pay

## 2020-11-19 DIAGNOSIS — M216X2 Other acquired deformities of left foot: Secondary | ICD-10-CM

## 2020-11-19 DIAGNOSIS — M722 Plantar fascial fibromatosis: Secondary | ICD-10-CM | POA: Diagnosis not present

## 2020-11-19 DIAGNOSIS — M779 Enthesopathy, unspecified: Secondary | ICD-10-CM

## 2020-11-19 DIAGNOSIS — M79672 Pain in left foot: Secondary | ICD-10-CM | POA: Diagnosis not present

## 2020-11-19 DIAGNOSIS — Z9889 Other specified postprocedural states: Secondary | ICD-10-CM | POA: Diagnosis not present

## 2020-11-19 DIAGNOSIS — S86012S Strain of left Achilles tendon, sequela: Secondary | ICD-10-CM

## 2020-11-19 NOTE — Progress Notes (Addendum)
Subjective: Anna Mclaughlin is a 65 y.o. female patient seen today in office for POV # 8 DOS 08-04-20), S/P Left achilles tendon repair and EPF. Patient reports that she is doing better states that it is still puffy and hurts when she wears a shoe states that her wound has healed up very well and she has been trying to be more compliant with resting more icing elevating and also doing contrast baths as I have previously directed.  Patient Active Problem List   Diagnosis Date Noted   Haglund's deformity of left heel 07/11/2020   Abnormality of left breast on screening mammogram 05/12/2020   Fatigue 03/07/2020   Prediabetes 08/17/2019   Family history of malignant neoplasm of digestive organs 06/13/2019   Chronic right-sided thoracic back pain 11/07/2018   Myalgia 03/02/2018   Varicose veins of right lower extremity with pain 11/16/2017   Primary osteoarthritis involving multiple joints 05/26/2017   Obesity (BMI 30-39.9) 10/28/2015   Degeneration of lumbar intervertebral disc 05/13/2015   Essential hypertension 05/13/2015   Gastroesophageal reflux disease without esophagitis 05/13/2015   Hearing loss 05/13/2015   High risk medication use 05/13/2015   Malaise and fatigue 05/13/2015   Menopausal disorder 05/13/2015   Mixed hyperlipidemia 05/13/2015   Vitamin D deficiency 05/13/2015   Generalized anxiety disorder 04/24/2013   Moderate episode of recurrent major depressive disorder (HCC) 04/24/2013   Routine history and physical examination of adult 09/14/2012   Meniere's disease 07/22/2012    Current Outpatient Medications on File Prior to Visit  Medication Sig Dispense Refill   tiZANidine (ZANAFLEX) 4 MG tablet Take 1 tablet by mouth every 6 (six) hours as needed.     ALPRAZolam (XANAX) 0.5 MG tablet Take 0.5 mg by mouth every 8 (eight) hours as needed.     Armodafinil 150 MG tablet Take 1/2 to 1 a day     citalopram (CELEXA) 20 MG tablet Take 1 a day     diclofenac (VOLTAREN) 75 MG EC  tablet Take 1 tablet (75 mg total) by mouth 2 (two) times daily. 30 tablet 0   docusate sodium (COLACE) 100 MG capsule Take 1 capsule (100 mg total) by mouth 2 (two) times daily. 10 capsule 0   DULoxetine (CYMBALTA) 60 MG capsule Take by mouth.     gabapentin (NEURONTIN) 300 MG capsule      gemfibrozil (LOPID) 600 MG tablet TAKE 1 TABLET BY MOUTH TWICE (2) DAILY     omeprazole (PRILOSEC) 20 MG capsule Take 20 mg by mouth daily.     omeprazole (PRILOSEC) 40 MG capsule Take 40 mg by mouth daily.     piroxicam (FELDENE) 20 MG capsule Take 20 mg by mouth daily.     predniSONE (STERAPRED UNI-PAK 21 TAB) 10 MG (21) TBPK tablet Take as directed 21 tablet 0   promethazine (PHENERGAN) 25 MG tablet Take 1 tablet (25 mg total) by mouth every 8 (eight) hours as needed for nausea or vomiting. 20 tablet 0   quinapril-hydrochlorothiazide (ACCURETIC) 20-25 MG tablet      rivaroxaban (XARELTO) 10 MG TABS tablet Take 1 tablet (10 mg total) by mouth daily. 30 tablet 0   tiZANidine (ZANAFLEX) 4 MG tablet Take by mouth.     traMADol (ULTRAM) 50 MG tablet      traZODone (DESYREL) 100 MG tablet Take 100 mg by mouth at bedtime.     No current facility-administered medications on file prior to visit.    Allergies  Allergen Reactions   Sulfa  Antibiotics Hives   Penicillins Other (See Comments) and Rash    Objective: There were no vitals filed for this visit.  General: No acute distress, AAOx3   Left foot: To the posterior heel surgical site well-healed with dry scabbing and areas of hyper trophic scar, mild swelling to left foot and posterior heel lateral aspect, no fluctuance, no erythema, no warmth, no drainage, no signs of infection noted, Capillary fill time <3 seconds in all digits, gross sensation present via light touch to left foot.  Mild pain to direct palpation to the posterior heel lateral aspect.  No pain with calf compression.   Assessment and Plan:  Problem List Items Addressed This Visit    None Visit Diagnoses     S/P foot surgery, left    -  Primary   Plantar fasciitis       Partial Achilles tendon tear, left, sequela       Pain of left heel       Acquired equinus deformity of left foot       Bone spur            -Patient seen and evaluated -Discussed with patient that there is a possibility of continued pain and swelling due to the extensive nature of her Achilles tendon surgery however because it is still hurting we will order an ultrasound to evaluate for any possible hematoma or underlying fluid collection that may need to be drained however clinically there is no warmth or fluctuance to this area or anything of major concern; ultrasound order sent to Scott at [facility] -Advised patient now use over-the-counter scar cream to surgical site -Continue with compression/support stockings for edema control like before -Continue with a shoe that does not rub the back of the heel -Advised patient to limit activity to tolerance with periods of rest -Advised patient to ice and elevate as instructed and continued with contrast baths as previously instructed -Continue with diclofenac as needed for pain and inflammation -Continue with physical therapy until completed -Will plan for review of ultrasound results, postoperative check and discuss return to work patient is scheduled to return on 10/20 on light duty restrictions.  [name], DPM

## 2020-11-21 ENCOUNTER — Encounter: Payer: Self-pay | Admitting: Sports Medicine

## 2020-11-21 DIAGNOSIS — Z9889 Other specified postprocedural states: Secondary | ICD-10-CM | POA: Diagnosis not present

## 2020-11-21 DIAGNOSIS — M79672 Pain in left foot: Secondary | ICD-10-CM | POA: Diagnosis not present

## 2020-11-21 DIAGNOSIS — Z23 Encounter for immunization: Secondary | ICD-10-CM | POA: Diagnosis not present

## 2020-11-21 DIAGNOSIS — S86002A Unspecified injury of left Achilles tendon, initial encounter: Secondary | ICD-10-CM | POA: Diagnosis not present

## 2020-11-25 ENCOUNTER — Ambulatory Visit (INDEPENDENT_AMBULATORY_CARE_PROVIDER_SITE_OTHER): Payer: Medicare HMO | Admitting: Sports Medicine

## 2020-11-25 DIAGNOSIS — R531 Weakness: Secondary | ICD-10-CM | POA: Diagnosis not present

## 2020-11-25 DIAGNOSIS — M25572 Pain in left ankle and joints of left foot: Secondary | ICD-10-CM | POA: Diagnosis not present

## 2020-11-25 DIAGNOSIS — S86012S Strain of left Achilles tendon, sequela: Secondary | ICD-10-CM

## 2020-11-25 DIAGNOSIS — M722 Plantar fascial fibromatosis: Secondary | ICD-10-CM

## 2020-11-25 DIAGNOSIS — M25672 Stiffness of left ankle, not elsewhere classified: Secondary | ICD-10-CM | POA: Diagnosis not present

## 2020-11-25 DIAGNOSIS — M216X2 Other acquired deformities of left foot: Secondary | ICD-10-CM

## 2020-11-25 DIAGNOSIS — M79672 Pain in left foot: Secondary | ICD-10-CM

## 2020-11-25 DIAGNOSIS — Z9889 Other specified postprocedural states: Secondary | ICD-10-CM

## 2020-11-25 MED ORDER — DICLOFENAC SODIUM 75 MG PO TBEC: 75.0000 mg | DELAYED_RELEASE_TABLET | Freq: Two times a day (BID) | ORAL | 0 refills | Status: AC

## 2020-11-25 NOTE — Progress Notes (Signed)
Virtual Visit via Telephone Note  I connected with Anna Mclaughlin on 11/25/20 at  5:00 PM EDT by telephone and verified that I am speaking with the correct person using two identifiers.  Location: Patient: Anna Mclaughlin, home Provider: Landis Martins, DPM, [facility], [place]    I discussed the limitations, risks, security and privacy concerns of performing an evaluation and management service by telephone and the availability of in person appointments. I also discussed with the patient that there may be a patient responsible charge related to this service. The patient expressed understanding and agreed to proceed.   History of Present Illness: 65 y/o patient met via telephone visit to discuss ultrasound results of the left lower extremity.  Patient states that she still has some swelling mostly at the end of the day otherwise states that pain is still slowly getting better and denies any other pedal complaints at this time.  Observations/Objective: Physical exam unable to be perform due to telephone nature of visit  Assessment and Plan: Problem List Items Addressed This Visit   None Visit Diagnoses     S/P foot surgery, left    -  Primary   Plantar fasciitis       Partial Achilles tendon tear, left, sequela       Pain of left heel       Acquired equinus deformity of left foot          Ultrasound performed at [facility] internal medicine negative for Achilles concern highly suggestive of thickening of the plantar fascia which is consistent with postoperative status  Discussed ultrasound results with patient Advised patient since swelling is normal postoperative status that she should try to use compression garments during the day to help with any swelling to the back of the heel or any areas of pain to give her left foot and ankle support Advised patient to continue with activities to tolerance and good supportive shoes that do not rub the back of the heel Refill  diclofenac to take as instructed Patient is scheduled to return to work on 12/05/2020 with restrictions of light duty maximum 20 hours/week with intermittent leave to account for flareups twice a month with 1 day to recover per episode; advised patient to contact her [facility] to see if they need additional updates from my office regarding these work duty restrictions  Follow Up Instructions: Advised patient call office for a follow-up appointment in 1 month or sooner if problems or issues arise  Advised patient that we must wait several months for her left foot to heal before we consider doing surgery on the right Achilles area.  Patient expressed understanding.   I discussed the assessment and treatment plan with the patient. The patient was provided an opportunity to ask questions and all were answered. The patient agreed with the plan and demonstrated an understanding of the instructions.   The patient was advised to call back or seek an in-person evaluation if the symptoms worsen or if the condition fails to improve as anticipated.  I provided 11 minutes of non-face-to-face time during this encounter.   Landis Martins, DPM

## 2020-12-05 DIAGNOSIS — Z23 Encounter for immunization: Secondary | ICD-10-CM | POA: Diagnosis not present

## 2020-12-19 ENCOUNTER — Other Ambulatory Visit: Payer: Self-pay

## 2020-12-19 ENCOUNTER — Ambulatory Visit (INDEPENDENT_AMBULATORY_CARE_PROVIDER_SITE_OTHER): Payer: Medicare HMO | Admitting: Sports Medicine

## 2020-12-19 DIAGNOSIS — M79672 Pain in left foot: Secondary | ICD-10-CM

## 2020-12-19 DIAGNOSIS — M722 Plantar fascial fibromatosis: Secondary | ICD-10-CM

## 2020-12-19 DIAGNOSIS — Z9889 Other specified postprocedural states: Secondary | ICD-10-CM

## 2020-12-19 DIAGNOSIS — M216X2 Other acquired deformities of left foot: Secondary | ICD-10-CM

## 2020-12-19 DIAGNOSIS — S86012S Strain of left Achilles tendon, sequela: Secondary | ICD-10-CM

## 2020-12-19 NOTE — Progress Notes (Signed)
Subjective: Anna Mclaughlin is a 65 y.o. female patient seen today in office for POV # 9 DOS 08-04-20), S/P Left achilles tendon repair and EPF. Patient reports that she is still having pain and cannot stand or walk for long periods of time so she had to quit working at [employer] part-time because of her ongoing pain states that also when she tries to wear normal shoe or broken back of the heel and the skin started to split and bleed.  Patient denies any other pedal complaints at this time.  Patient Active Problem List   Diagnosis Date Noted   Haglund's deformity of left heel 07/11/2020   Abnormality of left breast on screening mammogram 05/12/2020   Fatigue 03/07/2020   Prediabetes 08/17/2019   Family history of malignant neoplasm of digestive organs 06/13/2019   Chronic right-sided thoracic back pain 11/07/2018   Myalgia 03/02/2018   Varicose veins of right lower extremity with pain 11/16/2017   Primary osteoarthritis involving multiple joints 05/26/2017   Obesity (BMI 30-39.9) 10/28/2015   Degeneration of lumbar intervertebral disc 05/13/2015   Essential hypertension 05/13/2015   Gastroesophageal reflux disease without esophagitis 05/13/2015   Hearing loss 05/13/2015   High risk medication use 05/13/2015   Malaise and fatigue 05/13/2015   Menopausal disorder 05/13/2015   Mixed hyperlipidemia 05/13/2015   Vitamin D deficiency 05/13/2015   Generalized anxiety disorder 04/24/2013   Moderate episode of recurrent major depressive disorder (HCC) 04/24/2013   Routine history and physical examination of adult 09/14/2012   Meniere's disease 07/22/2012    Current Outpatient Medications on File Prior to Visit  Medication Sig Dispense Refill   ALPRAZolam (XANAX) 0.5 MG tablet Take 0.5 mg by mouth every 8 (eight) hours as needed.     Armodafinil 150 MG tablet Take 1/2 to 1 a day     citalopram (CELEXA) 20 MG tablet Take 1 a day     diclofenac (VOLTAREN) 75 MG EC tablet Take 1 tablet (75 mg  total) by mouth 2 (two) times daily. 180 tablet 0   docusate sodium (COLACE) 100 MG capsule Take 1 capsule (100 mg total) by mouth 2 (two) times daily. 10 capsule 0   DULoxetine (CYMBALTA) 60 MG capsule Take by mouth.     gabapentin (NEURONTIN) 300 MG capsule      gemfibrozil (LOPID) 600 MG tablet TAKE 1 TABLET BY MOUTH TWICE (2) DAILY     omeprazole (PRILOSEC) 20 MG capsule Take 20 mg by mouth daily.     omeprazole (PRILOSEC) 40 MG capsule Take 40 mg by mouth daily.     piroxicam (FELDENE) 20 MG capsule Take 20 mg by mouth daily.     predniSONE (STERAPRED UNI-PAK 21 TAB) 10 MG (21) TBPK tablet Take as directed 21 tablet 0   promethazine (PHENERGAN) 25 MG tablet Take 1 tablet (25 mg total) by mouth every 8 (eight) hours as needed for nausea or vomiting. 20 tablet 0   quinapril-hydrochlorothiazide (ACCURETIC) 20-25 MG tablet      rivaroxaban (XARELTO) 10 MG TABS tablet Take 1 tablet (10 mg total) by mouth daily. 30 tablet 0   tiZANidine (ZANAFLEX) 4 MG tablet Take by mouth.     tiZANidine (ZANAFLEX) 4 MG tablet Take 1 tablet by mouth every 6 (six) hours as needed.     traMADol (ULTRAM) 50 MG tablet      traZODone (DESYREL) 100 MG tablet Take 100 mg by mouth at bedtime.     No current facility-administered medications on file  prior to visit.    Allergies  Allergen Reactions   Sulfa Antibiotics Hives   Penicillins Other (See Comments) and Rash    Objective: There were no vitals filed for this visit.  General: No acute distress, AAOx3   Left foot: To the posterior heel surgical site well-healed with dry scabbing and areas of hyper trophic scar there is a small abrasion noted to the inferior most aspect of the incision likely from area of previously rubbing with no active drainage no periwound erythema no significant periwound edema, there is mild swelling to left foot and posterior heel lateral aspect with a pinpoint area of pain, no fluctuance, no erythema, no warmth, no drainage, no  signs of infection noted, Capillary fill time <3 seconds in all digits, gross sensation present via light touch to left foot.  Mild to moderate pain to direct palpation to the posterior heel lateral aspect.  No pain with calf compression.   Assessment and Plan:  Problem List Items Addressed This Visit   None Visit Diagnoses     S/P foot surgery, left    -  Primary   Plantar fasciitis       Partial Achilles tendon tear, left, sequela       Pain of left heel       Acquired equinus deformity of left foot            -Patient seen and evaluated -Discussed with patient that there is a possibility of continued pain and swelling due to the extensive nature of her Achilles tendon surgery with ultrasound negative for any acute tendon problem After oral consent and aseptic prep, injected a mixture containing 1 ml of 2%  plain lidocaine, 1 ml 0.5% plain marcaine, 0.5 ml of kenalog 10 and 0.5 ml of dexamethasone phosphate into left lateral heel to see if this will give her some symptomatic relief at the pinpoint area of pain without complication. Post-injection care discussed with patient.  -Continue with protective Band-Aid to the back of the heel to prevent rubbing and reopening of surgical site -Continue with compression sleeve for edema control -Advised patient to limit activity to tolerance with periods of rest like before -Advised patient to ice and elevate as instructed and continued with contrast baths as previously instructed -Continue with diclofenac as needed for pain and inflammation -Will plan follow-up on heel pain to see how she did after having an injection next visit if pain continues discussed with patient that we will have to get a repeat MRI however reassured patient that it is typical to take a really long time to heal after this type of Achilles surgery.  [name], DPM

## 2020-12-21 DIAGNOSIS — R0982 Postnasal drip: Secondary | ICD-10-CM | POA: Diagnosis not present

## 2020-12-21 DIAGNOSIS — J0100 Acute maxillary sinusitis, unspecified: Secondary | ICD-10-CM | POA: Diagnosis not present

## 2020-12-21 DIAGNOSIS — H1033 Unspecified acute conjunctivitis, bilateral: Secondary | ICD-10-CM | POA: Diagnosis not present

## 2021-01-21 ENCOUNTER — Encounter: Payer: Self-pay | Admitting: Sports Medicine

## 2021-01-21 ENCOUNTER — Ambulatory Visit (INDEPENDENT_AMBULATORY_CARE_PROVIDER_SITE_OTHER): Payer: Medicare HMO | Admitting: Sports Medicine

## 2021-01-21 DIAGNOSIS — M79672 Pain in left foot: Secondary | ICD-10-CM

## 2021-01-21 DIAGNOSIS — L910 Hypertrophic scar: Secondary | ICD-10-CM

## 2021-01-21 DIAGNOSIS — M216X2 Other acquired deformities of left foot: Secondary | ICD-10-CM

## 2021-01-21 DIAGNOSIS — M722 Plantar fascial fibromatosis: Secondary | ICD-10-CM

## 2021-01-21 DIAGNOSIS — S86012S Strain of left Achilles tendon, sequela: Secondary | ICD-10-CM

## 2021-01-21 DIAGNOSIS — Z9889 Other specified postprocedural states: Secondary | ICD-10-CM

## 2021-01-21 MED ORDER — TRIAMCINOLONE ACETONIDE 0.5 % EX OINT: 1.0000 "application " | TOPICAL_OINTMENT | Freq: Two times a day (BID) | CUTANEOUS | 0 refills | Status: AC

## 2021-01-21 NOTE — Progress Notes (Signed)
Subjective: Anna Mclaughlin is a 65 y.o. female patient seen today in office for POV # 10 (DOS 08-04-20), S/P Left achilles tendon repair and EPF. Patient reports that she is doing much better does not have any pain but has noticed her scar is gotten really thick on the left Achilles and states that after she had problems with her sinuses and was on a steroid pack as well her right heel has also calm down.  Patient denies any other pedal complaints at this time.  Patient had granddaughter with her at today's visit.  Patient Active Problem List   Diagnosis Date Noted   Haglund's deformity of left heel 07/11/2020   Abnormality of left breast on screening mammogram 05/12/2020   Fatigue 03/07/2020   Prediabetes 08/17/2019   Family history of malignant neoplasm of digestive organs 06/13/2019   Chronic right-sided thoracic back pain 11/07/2018   Myalgia 03/02/2018   Varicose veins of right lower extremity with pain 11/16/2017   Primary osteoarthritis involving multiple joints 05/26/2017   Obesity (BMI 30-39.9) 10/28/2015   Degeneration of lumbar intervertebral disc 05/13/2015   Essential hypertension 05/13/2015   Gastroesophageal reflux disease without esophagitis 05/13/2015   Hearing loss 05/13/2015   High risk medication use 05/13/2015   Malaise and fatigue 05/13/2015   Menopausal disorder 05/13/2015   Mixed hyperlipidemia 05/13/2015   Vitamin D deficiency 05/13/2015   Generalized anxiety disorder 04/24/2013   Moderate episode of recurrent major depressive disorder (HCC) 04/24/2013   Routine history and physical examination of adult 09/14/2012   Meniere's disease 07/22/2012    Current Outpatient Medications on File Prior to Visit  Medication Sig Dispense Refill   omeprazole (PRILOSEC) 40 MG capsule Take 1 capsule by mouth daily.     ALPRAZolam (XANAX) 0.5 MG tablet Take 0.5 mg by mouth every 8 (eight) hours as needed.     Armodafinil 150 MG tablet Take 1/2 to 1 a day     azithromycin  (ZITHROMAX) 250 MG tablet Take 250 mg by mouth as directed.     citalopram (CELEXA) 20 MG tablet Take 1 a day     diclofenac (VOLTAREN) 75 MG EC tablet Take 1 tablet (75 mg total) by mouth 2 (two) times daily. 180 tablet 0   docusate sodium (COLACE) 100 MG capsule Take 1 capsule (100 mg total) by mouth 2 (two) times daily. 10 capsule 0   DULoxetine (CYMBALTA) 60 MG capsule Take by mouth.     gabapentin (NEURONTIN) 300 MG capsule      gemfibrozil (LOPID) 600 MG tablet TAKE 1 TABLET BY MOUTH TWICE (2) DAILY     omeprazole (PRILOSEC) 20 MG capsule Take 20 mg by mouth daily.     piroxicam (FELDENE) 20 MG capsule Take 20 mg by mouth daily.     predniSONE (STERAPRED UNI-PAK 21 TAB) 10 MG (21) TBPK tablet Take as directed 21 tablet 0   promethazine (PHENERGAN) 25 MG tablet Take 1 tablet (25 mg total) by mouth every 8 (eight) hours as needed for nausea or vomiting. 20 tablet 0   quinapril-hydrochlorothiazide (ACCURETIC) 20-25 MG tablet      rivaroxaban (XARELTO) 10 MG TABS tablet Take 1 tablet (10 mg total) by mouth daily. 30 tablet 0   tiZANidine (ZANAFLEX) 4 MG tablet Take 1 tablet by mouth every 6 (six) hours as needed.     traMADol (ULTRAM) 50 MG tablet      traZODone (DESYREL) 100 MG tablet Take 100 mg by mouth at bedtime.  trimethoprim-polymyxin b (POLYTRIM) ophthalmic solution SMARTSIG:In Eye(s)     No current facility-administered medications on file prior to visit.    Allergies  Allergen Reactions   Sulfa Antibiotics Hives   Penicillins Other (See Comments) and Rash    Objective: There were no vitals filed for this visit.  General: No acute distress, AAOx3   Left foot: To the posterior heel surgical site well-healed with dry scabbing and areas of hyper trophic scar there is mild cracking or fissuring, no active drainage no periwound erythema no significant periwound edema, there is mild swelling to left foot and posterior heel lateral aspect with no pain at this area at this  visit, no fluctuance, no erythema, no warmth, no drainage, no signs of infection noted, Capillary fill time <3 seconds in all digits, gross sensation present via light touch to left foot.  No pain with direct palpation to the Achilles insertion on the left.  No pain with calf compression.   Right foot: No pain to palpation to the Achilles insertion on the right.  Assessment and Plan:  Problem List Items Addressed This Visit   None Visit Diagnoses     S/P foot surgery, left    -  Primary   Keloid of skin       Plantar fasciitis       Partial Achilles tendon tear, left, sequela       Pain of left heel       Acquired equinus deformity of left foot            -Patient seen and evaluated -Discussed treatment options for continued keloid at left heel -Scribed Kenalog ointment for patient to use twice daily and advised her to also alternate with Vaseline at the area of cracking or fissuring and to avoid a shoe that could rub the back of the heel -Advised patient to continue with home physical therapy and activities to tolerance -Advised patient that we will continue to monitor at this time since symptoms are resolved -Return as needed or sooner if problems or issues arise -[name], DPM

## 2021-01-25 DIAGNOSIS — J209 Acute bronchitis, unspecified: Secondary | ICD-10-CM | POA: Diagnosis not present

## 2021-01-25 DIAGNOSIS — R062 Wheezing: Secondary | ICD-10-CM | POA: Diagnosis not present

## 2021-01-25 DIAGNOSIS — Z20828 Contact with and (suspected) exposure to other viral communicable diseases: Secondary | ICD-10-CM | POA: Diagnosis not present

## 2021-02-02 DIAGNOSIS — I10 Essential (primary) hypertension: Secondary | ICD-10-CM | POA: Diagnosis not present

## 2021-02-02 DIAGNOSIS — Z634 Disappearance and death of family member: Secondary | ICD-10-CM | POA: Insufficient documentation

## 2021-02-02 DIAGNOSIS — F4321 Adjustment disorder with depressed mood: Secondary | ICD-10-CM | POA: Insufficient documentation

## 2021-02-02 DIAGNOSIS — R69 Illness, unspecified: Secondary | ICD-10-CM | POA: Diagnosis not present

## 2021-02-02 DIAGNOSIS — J40 Bronchitis, not specified as acute or chronic: Secondary | ICD-10-CM | POA: Diagnosis not present

## 2021-02-20 DIAGNOSIS — R69 Illness, unspecified: Secondary | ICD-10-CM | POA: Diagnosis not present

## 2021-02-20 DIAGNOSIS — Z79899 Other long term (current) drug therapy: Secondary | ICD-10-CM | POA: Diagnosis not present

## 2021-02-20 DIAGNOSIS — F5101 Primary insomnia: Secondary | ICD-10-CM | POA: Diagnosis not present

## 2021-02-20 DIAGNOSIS — F330 Major depressive disorder, recurrent, mild: Secondary | ICD-10-CM | POA: Diagnosis not present

## 2021-02-20 DIAGNOSIS — F411 Generalized anxiety disorder: Secondary | ICD-10-CM | POA: Diagnosis not present

## 2021-03-20 DIAGNOSIS — R69 Illness, unspecified: Secondary | ICD-10-CM | POA: Diagnosis not present

## 2021-03-20 DIAGNOSIS — K219 Gastro-esophageal reflux disease without esophagitis: Secondary | ICD-10-CM | POA: Diagnosis not present

## 2021-03-20 DIAGNOSIS — R7303 Prediabetes: Secondary | ICD-10-CM | POA: Diagnosis not present

## 2021-03-20 DIAGNOSIS — R5381 Other malaise: Secondary | ICD-10-CM | POA: Diagnosis not present

## 2021-03-20 DIAGNOSIS — R5383 Other fatigue: Secondary | ICD-10-CM | POA: Diagnosis not present

## 2021-03-20 DIAGNOSIS — Z79899 Other long term (current) drug therapy: Secondary | ICD-10-CM | POA: Diagnosis not present

## 2021-03-20 DIAGNOSIS — M159 Polyosteoarthritis, unspecified: Secondary | ICD-10-CM | POA: Diagnosis not present

## 2021-03-20 DIAGNOSIS — H918X1 Other specified hearing loss, right ear: Secondary | ICD-10-CM | POA: Diagnosis not present

## 2021-03-20 DIAGNOSIS — I10 Essential (primary) hypertension: Secondary | ICD-10-CM | POA: Diagnosis not present

## 2021-03-20 DIAGNOSIS — E782 Mixed hyperlipidemia: Secondary | ICD-10-CM | POA: Diagnosis not present

## 2021-03-20 DIAGNOSIS — M546 Pain in thoracic spine: Secondary | ICD-10-CM | POA: Diagnosis not present

## 2021-04-30 IMAGING — MR MR ANKLE*L* W/O CM
4 of 5 series · 29 of 40 positions shown · non-contrast
Comparison: Left foot x-rays dated September 17, 2019.

CLINICAL DATA: Heel pain for the past 3 months.

EXAM:
MRI OF THE LEFT ANKLE WITHOUT CONTRAST
TECHNIQUE: Multiplanar, multisequence MR imaging of the ankle was performed. No
intravenous contrast was administered.

[Series 3: T2 fat-sat · axial · 3.0mm · 0.66mm/px · z∈[-71,+70]mm · 8 of 40 slices shown (1 of 2)]
[im 1/40]
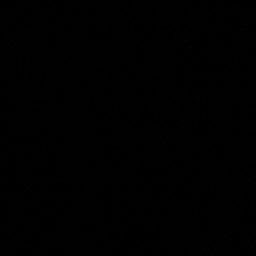
[im 6/40]
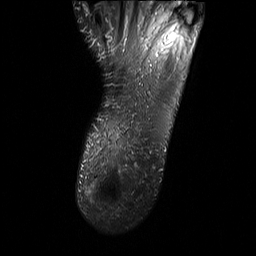
[im 12/40]
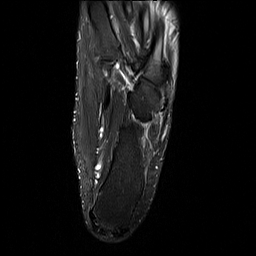
[im 17/40]
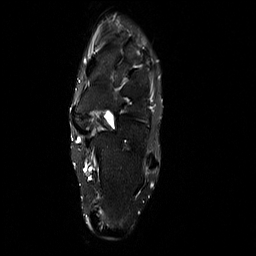
[im 23/40]
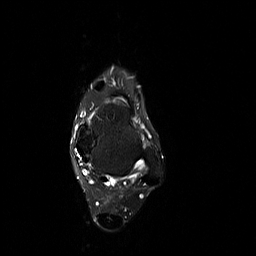
[im 28/40]
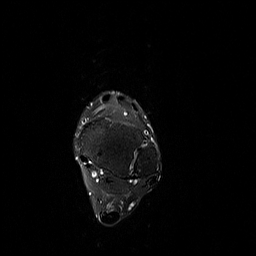
[im 34/40]
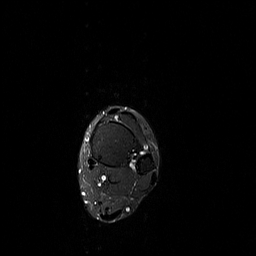
[im 40/40]
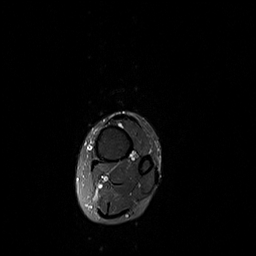

[Series 4: T1 · axial · 3.0mm · 0.50mm/px · z∈[-72,+68]mm · 8 of 40 slices shown (1 of 2)]
[im 1/40]
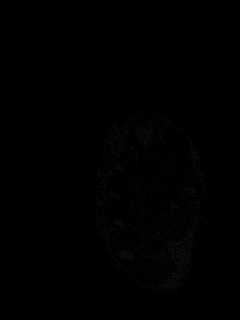
[im 6/40]
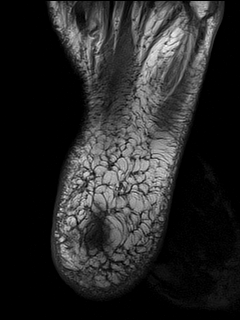
[im 12/40]
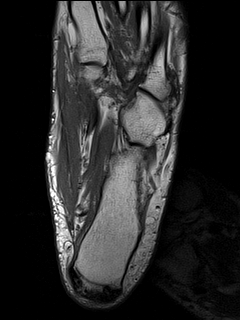
[im 17/40]
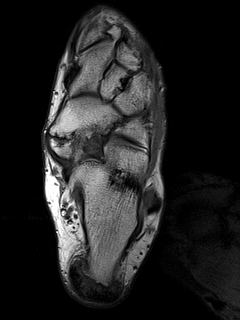
[im 23/40]
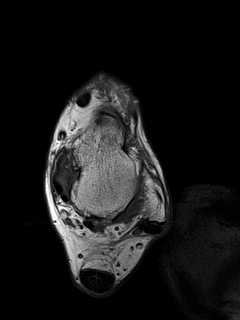
[im 28/40]
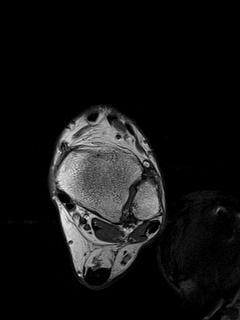
[im 34/40]
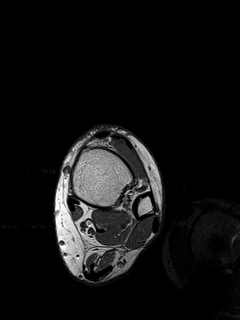
[im 40/40]
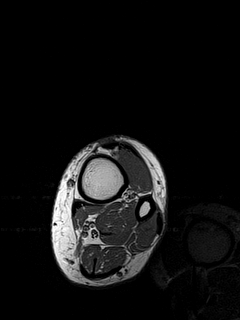

[Series 6: T1 · sagittal · 3.0mm · 0.31mm/px · 5 of 40 slices shown (2 of 2)]
[im 1/40]
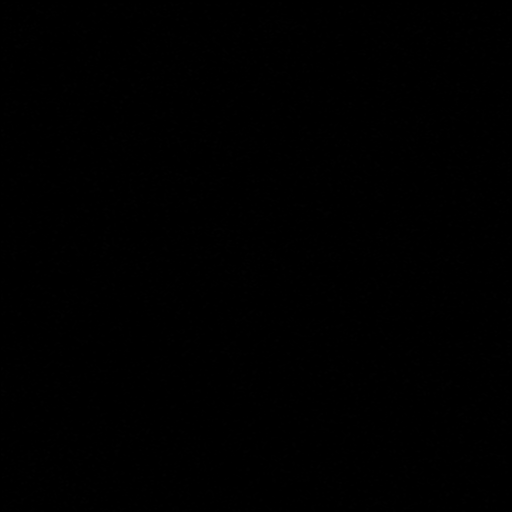
[im 6/40]
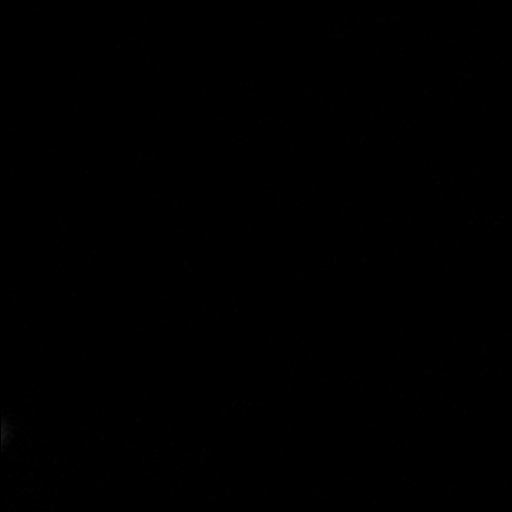
[im 12/40]
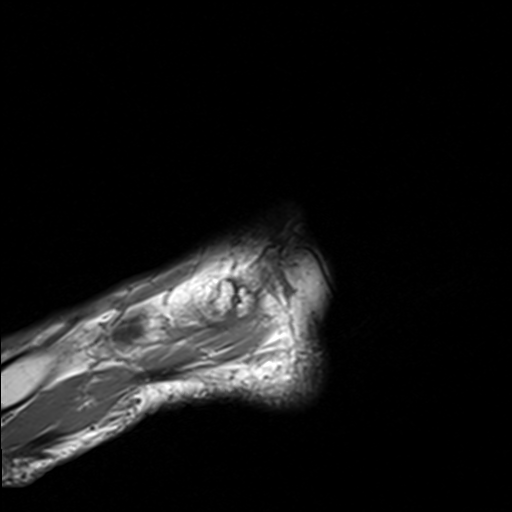
[im 23/40]
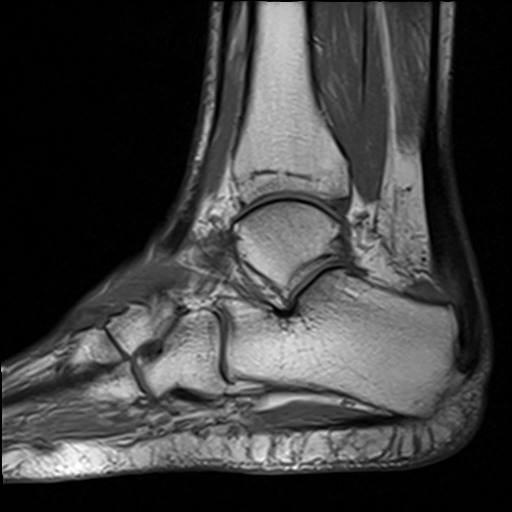
[im 34/40]
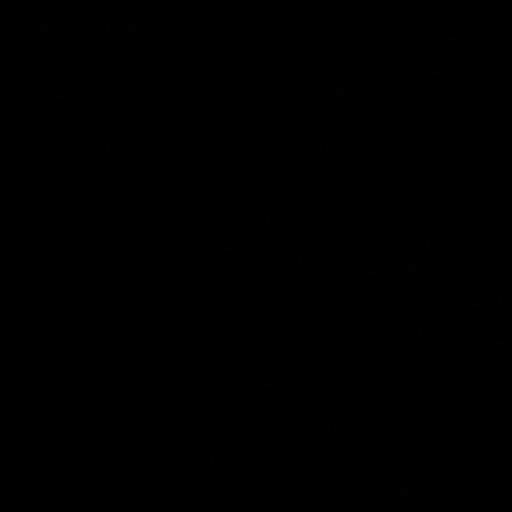

[Series 8: T2 fat-sat · coronal · 3.0mm · 0.31mm/px · 8 of 42 slices shown (2 of 2)]
[im 1/42]
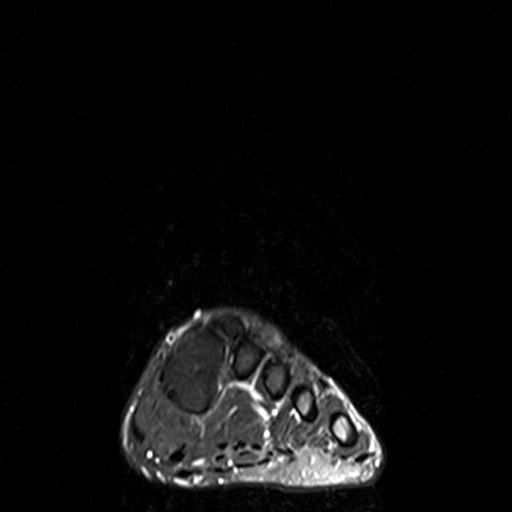
[im 6/42]
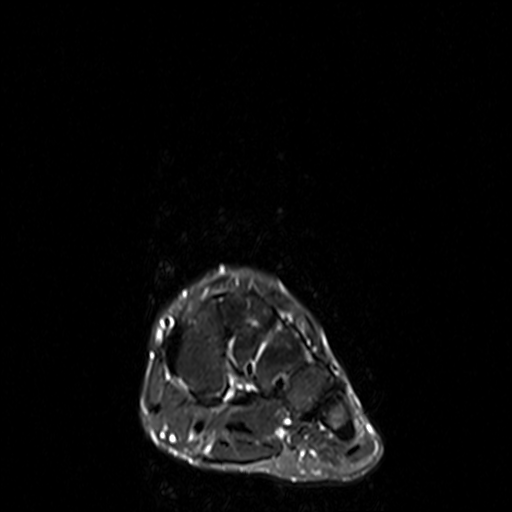
[im 12/42]
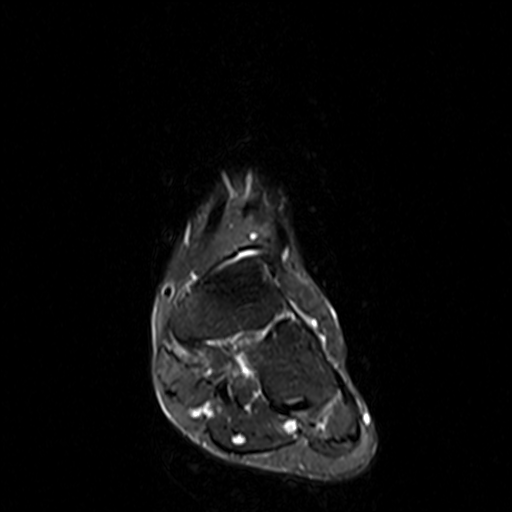
[im 18/42]
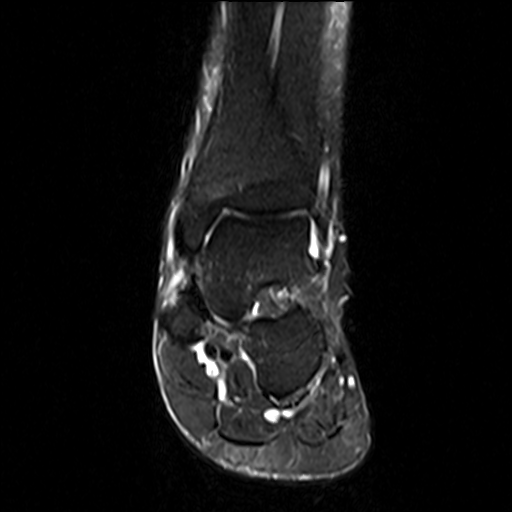
[im 24/42]
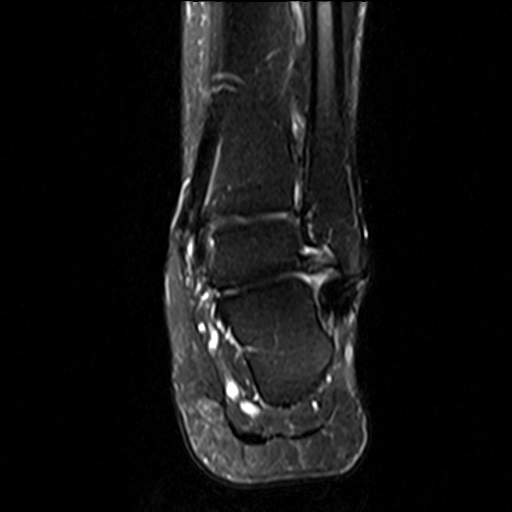
[im 30/42]
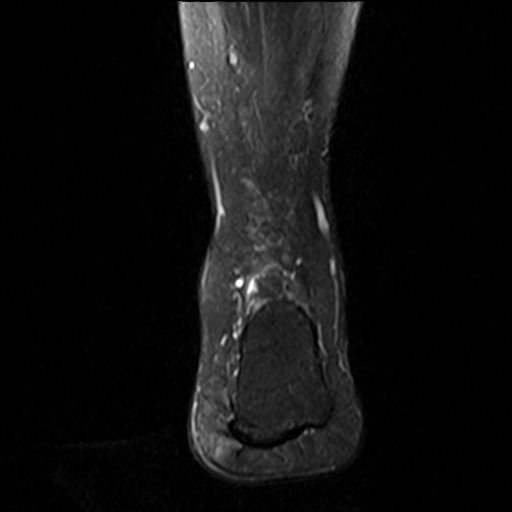
[im 36/42]
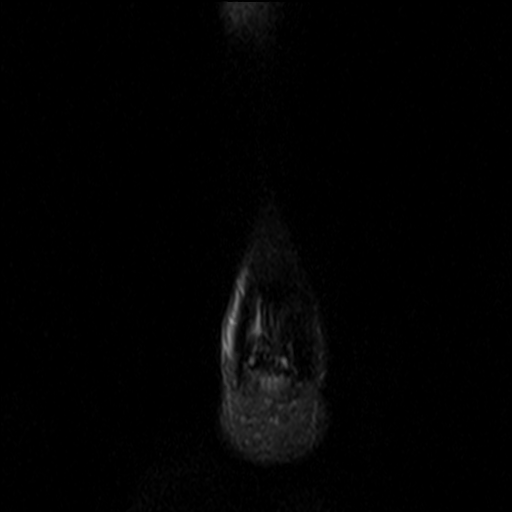
[im 42/42]
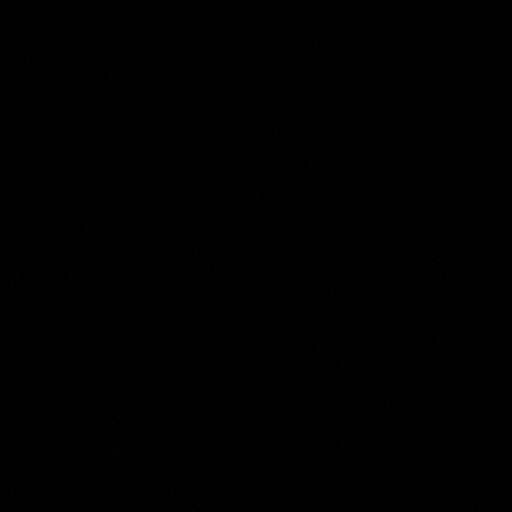

[29 of 40 positions shown; findings below may reference images not displayed]

FINDINGS: TENDONS

Peroneal: Peroneal longus tendon intact. Small split tear of the
peroneal brevis tendon inferior to the lateral malleolus.

Posteromedial: Posterior tibial tendon intact with small amount of
fluid in the tendon sheath. Flexor hallucis longus tendon intact.
Flexor digitorum longus tendon intact.

Anterior: Tibialis anterior tendon intact. Extensor hallucis longus
tendon intact Extensor digitorum longus tendon intact.

Achilles: Moderate to severe thickening of the mid to distal
Achilles tendon with small partial tear (series 7, image 20). Small
amount of fluid within the retrocalcaneal bursa.

Plantar Fascia: Intact.

LIGAMENTS

Lateral: Anterior talofibular ligament intact, best appreciated on
the sagittal images. Calcaneofibular ligament intact. Posterior
talofibular ligament intact. Anterior and posterior tibiofibular
ligaments intact.

Medial: Deltoid ligament intact. Spring ligament intact.

CARTILAGE

Ankle Joint: No joint effusion. Normal ankle mortise. No chondral
defect.

Subtalar Joints/Sinus Tarsi: Normal subtalar joints. No subtalar
joint effusion. Normal sinus tarsi.

Bones: No marrow signal abnormality. No fracture or dislocation.
Type 2 accessory navicular with mild irregularity of the
synchondrosis.

Soft Tissue: No soft tissue mass or fluid collection.
IMPRESSION: 1. Moderate to severe mid to distal Achilles tendinosis with small
partial tear.
2. Small peroneal brevis tendon split tear inferior to the lateral
malleolus.
3. Type 2 accessory navicular with mild irregularity of the
synchondrosis. This can be seen with accessory navicular syndrome.
4. Mild posterior tibial tenosynovitis.

## 2021-05-08 ENCOUNTER — Ambulatory Visit: Payer: Medicare HMO | Admitting: Sports Medicine

## 2021-05-08 ENCOUNTER — Encounter: Payer: Self-pay | Admitting: Sports Medicine

## 2021-05-08 ENCOUNTER — Other Ambulatory Visit: Payer: Self-pay

## 2021-05-08 ENCOUNTER — Ambulatory Visit (INDEPENDENT_AMBULATORY_CARE_PROVIDER_SITE_OTHER): Payer: Medicare HMO

## 2021-05-08 DIAGNOSIS — L910 Hypertrophic scar: Secondary | ICD-10-CM | POA: Diagnosis not present

## 2021-05-08 DIAGNOSIS — M7660 Achilles tendinitis, unspecified leg: Secondary | ICD-10-CM

## 2021-05-08 DIAGNOSIS — M7661 Achilles tendinitis, right leg: Secondary | ICD-10-CM

## 2021-05-08 DIAGNOSIS — M79671 Pain in right foot: Secondary | ICD-10-CM | POA: Diagnosis not present

## 2021-05-08 DIAGNOSIS — M79672 Pain in left foot: Secondary | ICD-10-CM

## 2021-05-08 DIAGNOSIS — Z9889 Other specified postprocedural states: Secondary | ICD-10-CM

## 2021-05-08 MED ORDER — SANARE SCAR THERAPY EX CREA: TOPICAL_CREAM | CUTANEOUS | 1 refills | Status: AC

## 2021-05-08 NOTE — Progress Notes (Signed)
Subjective: ?Anna Mclaughlin is a 66 y.o. female patient who presents to office for evaluation of Right> Left heel pain. Patient complains of progressive pain at the insertion of the Achilles very similar to how her left one used to hurt states that her left one does not hurt currently but she keeps getting skin peeling off and a very thick keloid to this area that seems like he does not want to get better.  Patient denies any other pedal complaints at this time. ? ?Patient Active Problem List  ? Diagnosis Date Noted  ? Grief at loss of child 02/02/2021  ? Haglund's deformity of left heel 07/11/2020  ? Abnormality of left breast on screening mammogram 05/12/2020  ? Fatigue 03/07/2020  ? Prediabetes 08/17/2019  ? Family history of malignant neoplasm of digestive organs 06/13/2019  ? Chronic right-sided thoracic back pain 11/07/2018  ? Myalgia 03/02/2018  ? Varicose veins of right lower extremity with pain 11/16/2017  ? Primary osteoarthritis involving multiple joints 05/26/2017  ? Obesity (BMI 30-39.9) 10/28/2015  ? Degeneration of lumbar intervertebral disc 05/13/2015  ? Essential hypertension 05/13/2015  ? Gastroesophageal reflux disease without esophagitis 05/13/2015  ? Hearing loss 05/13/2015  ? High risk medication use 05/13/2015  ? Malaise and fatigue 05/13/2015  ? Menopausal disorder 05/13/2015  ? Mixed hyperlipidemia 05/13/2015  ? Vitamin D deficiency 05/13/2015  ? Generalized anxiety disorder 04/24/2013  ? Moderate episode of recurrent major depressive disorder (HCC) 04/24/2013  ? Routine history and physical examination of adult 09/14/2012  ? Meniere's disease 07/22/2012  ? ? ?Current Outpatient Medications on File Prior to Visit  ?Medication Sig Dispense Refill  ? ARIPiprazole (ABILIFY) 2 MG tablet Take 1/2 tab at night for a week, then 1 at at night    ? lisinopril-hydrochlorothiazide (ZESTORETIC) 20-25 MG tablet Take 1 tablet by mouth daily.    ? ALPRAZolam (XANAX) 0.5 MG tablet Take 0.5 mg by mouth  every 8 (eight) hours as needed.    ? Armodafinil 150 MG tablet Take 1/2 to 1 a day    ? citalopram (CELEXA) 20 MG tablet Take 1 a day    ? diclofenac (VOLTAREN) 75 MG EC tablet Take 1 tablet (75 mg total) by mouth 2 (two) times daily. 180 tablet 0  ? docusate sodium (COLACE) 100 MG capsule Take 1 capsule (100 mg total) by mouth 2 (two) times daily. 10 capsule 0  ? DULoxetine (CYMBALTA) 60 MG capsule Take by mouth.    ? gabapentin (NEURONTIN) 300 MG capsule     ? gemfibrozil (LOPID) 600 MG tablet TAKE 1 TABLET BY MOUTH TWICE (2) DAILY    ? omeprazole (PRILOSEC) 40 MG capsule Take 1 capsule by mouth daily.    ? piroxicam (FELDENE) 20 MG capsule Take 20 mg by mouth daily.    ? promethazine (PHENERGAN) 25 MG tablet Take 1 tablet (25 mg total) by mouth every 8 (eight) hours as needed for nausea or vomiting. 20 tablet 0  ? quinapril-hydrochlorothiazide (ACCURETIC) 20-25 MG tablet     ? rivaroxaban (XARELTO) 10 MG TABS tablet Take 1 tablet (10 mg total) by mouth daily. 30 tablet 0  ? tiZANidine (ZANAFLEX) 4 MG tablet Take 1 tablet by mouth every 6 (six) hours as needed.    ? traMADol (ULTRAM) 50 MG tablet     ? traZODone (DESYREL) 100 MG tablet Take 100 mg by mouth at bedtime.    ? triamcinolone ointment (KENALOG) 0.5 % Apply 1 application topically 2 (two) times daily.  To area of scar 30 g 0  ? trimethoprim-polymyxin b (POLYTRIM) ophthalmic solution SMARTSIG:In Eye(s)    ? ?No current facility-administered medications on file prior to visit.  ? ? ?Allergies  ?Allergen Reactions  ? Sulfa Antibiotics Hives  ? Penicillins Other (See Comments) and Rash  ? ? ?Objective:  ?General: Alert and oriented x3 in no acute distress ? ?Dermatology: Hypertrophic scar noted to the posterior right heel at area of previous Achilles surgery.   ? ?Vascular: Dorsalis Pedis and Posterior Tibial pedal pulses 1/4, Capillary Fill Time 3 seconds, + pedal hair growth bilateral, no edema bilateral lower extremities, Temperature gradient within  normal limits. ? ?Neurology: Gross sensation intact via light touch bilateral. ? ?Musculoskeletal: Mild to moderate tenderness with palpation at insertion of the Achilles on Right>Left, there is calcaneal exostosis with mild soft tissue present and decreased ankle rom with knee extending  vs flexed resembling gastroc equnius bilateral, The achilles tendon feels intact with no nodularity or palpable dell bilateral, Thompson sign negative. ? ?Gait: Antalgic gait with increased heel off right ? ?Xrays  ?Right foot ?  Impression: ?Normal osseous mineralization. Joint spaces preserved. No fracture/dislocation/boney destruction. Calcaneal spur present. Kager's triangle intact with no obliteration. No soft tissue abnormalities or radiopaque foreign bodies.  ? ?Assessment and Plan: ?Problem List Items Addressed This Visit   ?None ?Visit Diagnoses   ? ? Achilles tendon pain    -  Primary  ? Relevant Orders  ? DG Foot Complete Right (Completed)  ? Achilles tendinitis, right leg      ? Pain of right heel      ? S/P foot surgery, left      ? Keloid of skin      ? Pain of left heel      ? ?  ? ? ?-Complete examination performed ?-Xrays reviewed ?-Discussed treatement options for now Achilles tendinitis of the right with keloid at old surgical scar on the left heel ?-After oral consent and aseptic prep, injected a mixture containing 1 ml of 2%  ?plain lidocaine, 1 ml 0.5% plain marcaine, 0.5 ml of kenalog 10 and 0.5 ml of dexamethasone phosphate into right heel at the medial Achilles insertion at the area of maximal pain without complication. Post-injection care discussed with patient.  ?-Advised patient to go home and to rest and avoid strenuous activity to prevent having post injection complications of tendon injury or tear ?-Dispensed heel lifts for patient to use as directed ?-Prescribed scar cream for patient to use at the keloid on the left heel advised patient if this cream does not help may benefit from intralesional/scar  injections ?-Advised icing as needed for any pain or inflammation on the heels ?-Patient to return to office in 6 weeks or sooner if problems or issues arise ?[name], DPM ? ?

## 2021-06-08 DIAGNOSIS — F5101 Primary insomnia: Secondary | ICD-10-CM | POA: Diagnosis not present

## 2021-06-08 DIAGNOSIS — F411 Generalized anxiety disorder: Secondary | ICD-10-CM | POA: Diagnosis not present

## 2021-06-08 DIAGNOSIS — Z79899 Other long term (current) drug therapy: Secondary | ICD-10-CM | POA: Diagnosis not present

## 2021-06-08 DIAGNOSIS — F330 Major depressive disorder, recurrent, mild: Secondary | ICD-10-CM | POA: Diagnosis not present

## 2021-06-08 DIAGNOSIS — R69 Illness, unspecified: Secondary | ICD-10-CM | POA: Diagnosis not present

## 2021-06-10 ENCOUNTER — Ambulatory Visit: Payer: Medicare HMO | Admitting: Sports Medicine

## 2021-06-25 DIAGNOSIS — Z1231 Encounter for screening mammogram for malignant neoplasm of breast: Secondary | ICD-10-CM | POA: Diagnosis not present

## 2021-07-08 DIAGNOSIS — H524 Presbyopia: Secondary | ICD-10-CM | POA: Diagnosis not present

## 2021-07-08 DIAGNOSIS — Z0100 Encounter for examination of eyes and vision without abnormal findings: Secondary | ICD-10-CM | POA: Diagnosis not present

## 2021-08-03 DIAGNOSIS — R5381 Other malaise: Secondary | ICD-10-CM | POA: Diagnosis not present

## 2021-08-03 DIAGNOSIS — I10 Essential (primary) hypertension: Secondary | ICD-10-CM | POA: Diagnosis not present

## 2021-08-03 DIAGNOSIS — R69 Illness, unspecified: Secondary | ICD-10-CM | POA: Diagnosis not present

## 2021-08-03 DIAGNOSIS — R5383 Other fatigue: Secondary | ICD-10-CM | POA: Diagnosis not present

## 2021-08-03 DIAGNOSIS — R7989 Other specified abnormal findings of blood chemistry: Secondary | ICD-10-CM | POA: Diagnosis not present

## 2021-09-04 DIAGNOSIS — Z79899 Other long term (current) drug therapy: Secondary | ICD-10-CM | POA: Diagnosis not present

## 2021-09-04 DIAGNOSIS — F5101 Primary insomnia: Secondary | ICD-10-CM | POA: Diagnosis not present

## 2021-09-04 DIAGNOSIS — F330 Major depressive disorder, recurrent, mild: Secondary | ICD-10-CM | POA: Diagnosis not present

## 2021-09-04 DIAGNOSIS — R69 Illness, unspecified: Secondary | ICD-10-CM | POA: Diagnosis not present

## 2021-09-04 DIAGNOSIS — F411 Generalized anxiety disorder: Secondary | ICD-10-CM | POA: Diagnosis not present

## 2021-09-18 DIAGNOSIS — S301XXA Contusion of abdominal wall, initial encounter: Secondary | ICD-10-CM | POA: Diagnosis not present

## 2021-11-09 ENCOUNTER — Ambulatory Visit: Payer: Medicare HMO | Admitting: Podiatry

## 2021-11-09 DIAGNOSIS — M7661 Achilles tendinitis, right leg: Secondary | ICD-10-CM

## 2021-11-09 NOTE — Progress Notes (Signed)
Subjective:  Patient ID: Anna Mclaughlin, female    DOB: 10-08-1955,  MRN: [id number]  Chief Complaint  Patient presents with   Foot Pain    Patient has a pain to the back of right heel. Steroid injection that was given on 05/08/2021 was effective.     66 y.o. female presents for follow-up of pain at the back of her right heel.  She has previously seen Dr. Marylene Land in March 2023.  She had a steroid injection performed at last visit and said that helped a lot with her pain she was having in the back of her right heel.  She has tried shoe gear modification and anti-inflammatory use without much effect.  She does report she has tried stretching as well and tries to do this is much as she can.  She is here hoping to get another injection today.  Had a lot of relief after the last shot.  No past medical history on file.  Allergies  Allergen Reactions   Sulfa Antibiotics Hives   Penicillins Other (See Comments) and Rash    ROS: Negative except as per HPI above  Objective:  General: AAO x3, NAD  Dermatological: With inspection and palpation of the right and left lower extremities there are no open sores, no preulcerative lesions, no rash or signs of infection present. Nails are of normal length thickness and coloration.   Vascular:  Dorsalis Pedis artery and Posterior Tibial artery pedal pulses are 2/4 bilateral.  Capillary fill time brisk < 3 sec. Pedal hair growth present. No varicosities and no lower extremity edema present bilateral. There is no pain with calf compression, swelling, warmth, erythema.   Neruologic: Grossly intact via light touch bilateral. Protective threshold intact to all sites bilateral. Patellar and Achilles deep tendon reflexes 2+ bilateral. Negative Babinski reflex.   Musculoskeletal: Palpable osseous spurring noted at the posterior right heel. Pain with palpation of the posterior superior aspect of the right calcaneus.   Gait: Unassisted, Nonantalgic.   No images  are attached to the encounter.  Radiographs:  Date: 05/08/21 XR the right foot Weightbearing AP/Lateral/Oblique   Findings: Calcaneal spur present. Kager's triangle intact with no obliteration. No soft tissue abnormalities or radiopaque foreign bodies.  Assessment:   1. Achilles tendinitis, right leg      Plan:  Patient was evaluated and treated and all questions answered.  #Insertional Achilles tendinitis on the right foot Discussed the etiology and treatment options for Achilles tendinitis including stretching, formal physical therapy with an eccentric exercises therapy plan, supportive shoegears such as a running shoe or sneaker, heel lifts, topical and oral medications.    We also discussed the role of surgical treatment of this for patients who do not improve after exhausting non-surgical treatment options. -Discussed surgical correction including resection of the osseous spurring and Haglund's deformity present at the posterior superior aspect of the calcaneus with detachment and reattachment of her Achilles tendon.  Discussed the postoperative timeframe for recovery.  Discussed risk benefits and alternatives to this surgery.  She would like to hold off on this for now but will consider it if the pain worsens or does not improve after the steroid injection today. -XR reviewed with patient -Educated on stretching and icing of the affected limb. -Patient is a steroid injection which has previously been done by Dr. Marylene Land in the past to her right posterior heel. Educated this can have risk of tendon rupture but she would still like to proceed.  I did add  Savella -After oral consent and aseptic prep, injected a mixture containing 1 ml 0.5% marcaine and, 1 ml of kenalog 10 into right heel at the medial Achilles insertion at the area of maximal pain without complication. Post-injection care discussed with patient.    Return in about 3 months (around 02/08/2022) for follow up R insertional  achilles tendinitis .          Everitt Amber, DPM [facility]

## 2021-11-26 ENCOUNTER — Telehealth: Payer: Self-pay | Admitting: Podiatry

## 2021-11-26 NOTE — Telephone Encounter (Signed)
Patient called she is ready to schedule the surgery but she would like to discuss the procedure with you before she gets it scheduled.  She spoke with the surgery center and they said that she needs to talk to you before she schedules.

## 2021-11-27 DIAGNOSIS — F5101 Primary insomnia: Secondary | ICD-10-CM | POA: Diagnosis not present

## 2021-11-27 DIAGNOSIS — Z79899 Other long term (current) drug therapy: Secondary | ICD-10-CM | POA: Diagnosis not present

## 2021-11-27 DIAGNOSIS — F330 Major depressive disorder, recurrent, mild: Secondary | ICD-10-CM | POA: Diagnosis not present

## 2021-11-27 DIAGNOSIS — R69 Illness, unspecified: Secondary | ICD-10-CM | POA: Diagnosis not present

## 2021-11-27 DIAGNOSIS — F411 Generalized anxiety disorder: Secondary | ICD-10-CM | POA: Diagnosis not present

## 2021-12-04 ENCOUNTER — Ambulatory Visit (INDEPENDENT_AMBULATORY_CARE_PROVIDER_SITE_OTHER): Payer: Medicare HMO

## 2021-12-04 ENCOUNTER — Ambulatory Visit: Payer: Medicare HMO | Admitting: Podiatry

## 2021-12-04 DIAGNOSIS — M9261 Juvenile osteochondrosis of tarsus, right ankle: Secondary | ICD-10-CM

## 2021-12-04 DIAGNOSIS — M79671 Pain in right foot: Secondary | ICD-10-CM | POA: Diagnosis not present

## 2021-12-04 DIAGNOSIS — I10 Essential (primary) hypertension: Secondary | ICD-10-CM | POA: Diagnosis not present

## 2021-12-04 DIAGNOSIS — M7661 Achilles tendinitis, right leg: Secondary | ICD-10-CM | POA: Diagnosis not present

## 2021-12-04 DIAGNOSIS — R5381 Other malaise: Secondary | ICD-10-CM | POA: Diagnosis not present

## 2021-12-04 DIAGNOSIS — Z23 Encounter for immunization: Secondary | ICD-10-CM | POA: Diagnosis not present

## 2021-12-04 DIAGNOSIS — R5383 Other fatigue: Secondary | ICD-10-CM | POA: Diagnosis not present

## 2021-12-04 DIAGNOSIS — M7660 Achilles tendinitis, unspecified leg: Secondary | ICD-10-CM

## 2021-12-04 DIAGNOSIS — E782 Mixed hyperlipidemia: Secondary | ICD-10-CM | POA: Diagnosis not present

## 2021-12-04 DIAGNOSIS — R69 Illness, unspecified: Secondary | ICD-10-CM | POA: Diagnosis not present

## 2021-12-04 DIAGNOSIS — M216X1 Other acquired deformities of right foot: Secondary | ICD-10-CM

## 2021-12-04 DIAGNOSIS — M5136 Other intervertebral disc degeneration, lumbar region: Secondary | ICD-10-CM | POA: Diagnosis not present

## 2021-12-04 DIAGNOSIS — Z0000 Encounter for general adult medical examination without abnormal findings: Secondary | ICD-10-CM | POA: Diagnosis not present

## 2021-12-04 DIAGNOSIS — M159 Polyosteoarthritis, unspecified: Secondary | ICD-10-CM | POA: Diagnosis not present

## 2021-12-04 NOTE — Progress Notes (Unsigned)
Subjective:  Patient ID: Anna Mclaughlin, female    DOB: January 30, 1956,  MRN: [id number]  Chief Complaint  Patient presents with   Consult    Surgery Consult- Achilles Tendinitis.     66 y.o. female presents for follow-up of pain at the back of her right heel.  She has previously seen Dr. Cannon Kettle in March 2023.  She had a steroid injection performed at last visit and said that helped a lot with her pain she was having in the back of her right heel.  She has tried shoe gear modification and anti-inflammatory use without much effect.  She does report she has tried stretching as well and tries to do this is much as she can.  She is here hoping to get another injection today.  Had a lot of relief after the last shot.  No past medical history on file.  Allergies  Allergen Reactions   Sulfa Antibiotics Hives   Penicillins Other (See Comments) and Rash    ROS: Negative except as per HPI above  Objective:  General: AAO x3, NAD  Dermatological: With inspection and palpation of the right and left lower extremities there are no open sores, no preulcerative lesions, no rash or signs of infection present. Nails are of normal length thickness and coloration.   Vascular:  Dorsalis Pedis artery and Posterior Tibial artery pedal pulses are 2/4 bilateral.  Capillary fill time brisk < 3 sec. Pedal hair growth present. No varicosities and no lower extremity edema present bilateral. There is no pain with calf compression, swelling, warmth, erythema.   Neruologic: Grossly intact via light touch bilateral. Protective threshold intact to all sites bilateral. Patellar and Achilles deep tendon reflexes 2+ bilateral. Negative Babinski reflex.   Musculoskeletal: Palpable osseous spurring noted at the posterior right heel. Pain with palpation of the posterior superior aspect of the right calcaneus.   Gait: Unassisted, Nonantalgic.   No images are attached to the encounter.  Radiographs:  Date: 05/08/21 XR the  right foot Weightbearing AP/Lateral/Oblique   Findings: Calcaneal spur present. Kager's triangle intact with no obliteration. No soft tissue abnormalities or radiopaque foreign bodies.  Assessment:   No diagnosis found.    Plan:  Patient was evaluated and treated and all questions answered.  #Insertional Achilles tendinitis on the right foot Discussed the etiology and treatment options for Achilles tendinitis including stretching, formal physical therapy with an eccentric exercises therapy plan, supportive shoegears such as a running shoe or sneaker, heel lifts, topical and oral medications.    We also discussed the role of surgical treatment of this for patients who do not improve after exhausting non-surgical treatment options. -Discussed surgical correction including resection of the osseous spurring and Haglund's deformity present at the posterior superior aspect of the calcaneus with detachment and reattachment of her Achilles tendon.  Discussed the postoperative timeframe for recovery.  Discussed risk benefits and alternatives to this surgery.  She would like to hold off on this for now but will consider it if the pain worsens or does not improve after the steroid injection today. -XR reviewed with patient -Educated on stretching and icing of the affected limb. -Patient is a steroid injection which has previously been done by Dr. Cannon Kettle in the past to her right posterior heel. Educated this can have risk of tendon rupture but she would still like to proceed.  I did add Savella -After oral consent and aseptic prep, injected a mixture containing 1 ml 0.5% marcaine and, 1 ml of kenalog  10 into right heel at the medial Achilles insertion at the area of maximal pain without complication. Post-injection care discussed with patient.    No follow-ups on file.          Corinna Gab, DPM [facility] / [facility]

## 2021-12-08 ENCOUNTER — Ambulatory Visit: Payer: Medicare HMO | Admitting: Podiatry

## 2021-12-10 ENCOUNTER — Telehealth: Payer: Self-pay | Admitting: Podiatry

## 2021-12-10 NOTE — Telephone Encounter (Signed)
DOS: 12/23/2021  [employer]  Procedures: Repair Tendon Rt ([id number]), Gastrocnemius Recess Rt ([id number]), and Calcaneal Ostectomy Rt (76734)  DX: M76.61, M21.6X1, and M92.61  Deductible: $0 Out-of-Pocket: $4,500 with $4,290 remaining  Prior Authorization is Not required per Max C.  Call Reference #: [id number]

## 2021-12-23 ENCOUNTER — Telehealth: Payer: Self-pay | Admitting: Podiatry

## 2021-12-23 ENCOUNTER — Other Ambulatory Visit (INDEPENDENT_AMBULATORY_CARE_PROVIDER_SITE_OTHER): Payer: Medicare HMO | Admitting: Podiatry

## 2021-12-23 ENCOUNTER — Encounter: Payer: Self-pay | Admitting: Podiatry

## 2021-12-23 DIAGNOSIS — Z9889 Other specified postprocedural states: Secondary | ICD-10-CM

## 2021-12-23 DIAGNOSIS — M9261 Juvenile osteochondrosis of tarsus, right ankle: Secondary | ICD-10-CM

## 2021-12-23 DIAGNOSIS — M2021 Hallux rigidus, right foot: Secondary | ICD-10-CM | POA: Diagnosis not present

## 2021-12-23 DIAGNOSIS — M7731 Calcaneal spur, right foot: Secondary | ICD-10-CM | POA: Diagnosis not present

## 2021-12-23 DIAGNOSIS — M7661 Achilles tendinitis, right leg: Secondary | ICD-10-CM | POA: Diagnosis not present

## 2021-12-23 DIAGNOSIS — M216X1 Other acquired deformities of right foot: Secondary | ICD-10-CM | POA: Diagnosis not present

## 2021-12-23 DIAGNOSIS — G8918 Other acute postprocedural pain: Secondary | ICD-10-CM | POA: Diagnosis not present

## 2021-12-23 DIAGNOSIS — M66361 Spontaneous rupture of flexor tendons, right lower leg: Secondary | ICD-10-CM | POA: Diagnosis not present

## 2021-12-23 MED ORDER — OXYCODONE HCL 5 MG PO TABS: 5.0000 mg | ORAL_TABLET | ORAL | 0 refills | Status: AC | PRN

## 2021-12-23 MED ORDER — RIVAROXABAN 10 MG PO TABS: 10.0000 mg | ORAL_TABLET | Freq: Every day | ORAL | 0 refills | Status: AC

## 2021-12-23 MED ORDER — OXYCODONE-ACETAMINOPHEN 5-325 MG PO TABS: 1.0000 | ORAL_TABLET | ORAL | 0 refills | Status: DC | PRN

## 2021-12-23 MED ORDER — CLINDAMYCIN HCL 300 MG PO CAPS: 300.0000 mg | ORAL_CAPSULE | Freq: Three times a day (TID) | ORAL | 0 refills | Status: AC

## 2021-12-23 NOTE — Telephone Encounter (Signed)
Pharmacist called and is currently out of stock of the oxyCODONE-acetaminophen (PERCOCET/ROXICET) 5-325 MG tablet   He suggest to send in a new RX for the plain 5mg    [facility] - [place], [place] - [address]   Please advise

## 2021-12-23 NOTE — Progress Notes (Signed)
Postop medications sent 

## 2021-12-23 NOTE — Progress Notes (Signed)
Sent rx for Oxy IR as percocet out of stock at the patient's pharmacy

## 2021-12-24 NOTE — Telephone Encounter (Signed)
Noted, thanks!

## 2021-12-29 ENCOUNTER — Ambulatory Visit (INDEPENDENT_AMBULATORY_CARE_PROVIDER_SITE_OTHER): Payer: Medicare HMO

## 2021-12-29 ENCOUNTER — Ambulatory Visit (INDEPENDENT_AMBULATORY_CARE_PROVIDER_SITE_OTHER): Payer: Medicare HMO | Admitting: Podiatry

## 2021-12-29 DIAGNOSIS — M2021 Hallux rigidus, right foot: Secondary | ICD-10-CM

## 2021-12-29 DIAGNOSIS — Z9889 Other specified postprocedural states: Secondary | ICD-10-CM

## 2021-12-29 DIAGNOSIS — M9261 Juvenile osteochondrosis of tarsus, right ankle: Secondary | ICD-10-CM

## 2021-12-29 DIAGNOSIS — M7661 Achilles tendinitis, right leg: Secondary | ICD-10-CM

## 2021-12-29 NOTE — Progress Notes (Signed)
  Subjective:  Patient ID: Anna Mclaughlin, female    DOB: 1955/04/30,  MRN: [id number]  Chief Complaint  Patient presents with   Routine Post Op    POV # 1 DOS 12/23/21 --- RIGHT RETROCALCANEAL EXOSTECTOMY WITH ACHILLES TENDON REPAIR, POSSIBLE GASTROCNEMIUS RECESSION    DOS: 12/23/2021 Procedure: Right lower extremity gastrocnemius recession, retrocalcaneal exostectomy and secondary repair of Achilles tendon right  66 y.o. female returns for post-op check.  Patient reports she is having some pain still but not as bad as some nausea and upset stomach that she has been experiencing. Thinks it may be due to the antibiotic she was taking.  She has maintained nonweightbearing status.  Kept the dressing clean dry and intact as instructed since surgery. Has been taking xarelto 10 mg daily for dvt px. Has not needed any percocet. Just taking some gabapentin for pain.   Review of Systems: Negative except as noted in the HPI. Denies N/V/F/Ch.   Objective:  There were no vitals filed for this visit. There is no height or weight on file to calculate BMI. Constitutional Well developed. Well nourished.  Vascular Foot warm and well perfused. Capillary refill normal to all digits.  Calf is soft and supple, no posterior calf or knee pain, negative Homans' sign  Neurologic Normal speech. Oriented to person, place, and time. Epicritic sensation to light touch grossly present bilaterally.  Dermatologic Skin healing well without signs of infection. Skin edges well coapted without signs of infection.  Orthopedic: Tenderness to palpation noted about the surgical site.   Multiple view plain film radiographs: XR right foot 3 views nonweightbearing AP lateral oblique demonstrates resection of the posterior superior aspect of the right canis with reduction and osseous spurring at the Achilles insertion.  No acute fracture or other complication noted on exam. Assessment:   1. Post-operative state   2. Haglund's  deformity of right heel   3. Achilles tendinitis, right leg    Plan:  Patient was evaluated and treated and all questions answered.  S/p foot surgery right lower extremity gastroc recession with Haglund's resection and retrocalcaneal exostectomy and secondary repair of Achilles -Progressing as expected post-operatively. -XR: As above no acute complication identified -WB Status: Nonweightbearing in posterior splint for another week. -Sutures: To remain intact until next visit. -Medications: Continue pain medication including gabapentin as needed.  Continue Xarelto 10 mg tablets daily for DVT prophylaxis. -Foot redressed and splint reapplied.  Return in about 1 week (around 01/05/2022) for 2nd POV right Haglund's resection gastroc recession.         Corinna Gab, DPM [facility] / [facility]

## 2022-01-11 ENCOUNTER — Ambulatory Visit (INDEPENDENT_AMBULATORY_CARE_PROVIDER_SITE_OTHER): Payer: Medicare HMO | Admitting: Podiatry

## 2022-01-11 ENCOUNTER — Ambulatory Visit (INDEPENDENT_AMBULATORY_CARE_PROVIDER_SITE_OTHER): Payer: Medicare HMO

## 2022-01-11 DIAGNOSIS — M9261 Juvenile osteochondrosis of tarsus, right ankle: Secondary | ICD-10-CM

## 2022-01-11 DIAGNOSIS — Z9889 Other specified postprocedural states: Secondary | ICD-10-CM

## 2022-01-11 DIAGNOSIS — M7661 Achilles tendinitis, right leg: Secondary | ICD-10-CM

## 2022-01-11 NOTE — Progress Notes (Signed)
  Subjective:  Patient ID: Anna Mclaughlin, female    DOB: [birth year],  MRN: [id number]  Chief Complaint  Patient presents with   Routine Post Op    Pt states she has no concerns     DOS: 12/23/2021 Procedure: Right lower extremity gastrocnemius recession, retrocalcaneal exostectomy and secondary repair of Achilles tendon right  66 y.o. female returns for 2nd post-op check.  She reports she has been doing well since last visit.  She has kept the splint dressing clean dry and intact.  She denies any significant pain and is not taking any pain medication at this point time.  She is taking Xarelto 10 mg daily for DVT prophylaxis.  Review of Systems: Negative except as noted in the HPI. Denies N/V/F/Ch.   Objective:  There were no vitals filed for this visit. There is no height or weight on file to calculate BMI. Constitutional Well developed. Well nourished.  Vascular Foot warm and well perfused. Capillary refill normal to all digits.  Calf is soft and supple, no posterior calf or knee pain, negative Homans' sign  Neurologic Normal speech. Oriented to person, place, and time. Epicritic sensation to light touch grossly present bilaterally.  Dermatologic Skin healing well without signs of infection. Skin edges well coapted, at the very distal aspect of the incision there is a small pinpoint area of necrosis however it is not deep and overall the incision still well coapted with no drainage anywhere along it.  Gastroc incision also healing well with no issues.  Orthopedic: Mild tenderness to palpation noted about the surgical site.  Patient has positive plantarflexion strength no evidence of disruption of the hardware clinically.   Multiple view plain film radiographs: XR right foot 3 views nonweightbearing AP lateral oblique 01/11/22 demonstrates resection of the posterior superior aspect of the right heel with reduction and osseous spurring at the Achilles insertion.  No acute fracture or  other complication noted on exam.  Exam altogether unchanged from last visit. Assessment:   1. Post-operative state   2. Haglund's deformity of right heel   3. Achilles tendinitis, right leg    Plan:  Patient was evaluated and treated and all questions answered.  S/p foot surgery right lower extremity gastroc recession with Haglund's resection and retrocalcaneal exostectomy and secondary repair of Achilles -Progressing as expected post-operatively. -XR: As above no acute complication identified -WB Status: Nonweightbearing in cam boot to right lower extremity with a 1 inch heel lift present in the heel. -Sutures: Removed at this visit.  Steri-Strips placed across the gastroc incision and the posterior Achilles incision. -Medications:  Continue Xarelto 10 mg tablets daily for DVT prophylaxis. -Foot redressed with dry gauze and Kerlix roll as well as Ace wrap.  Cam boot was applied. -Patient is to keep the current dressing intact until this weekend and then can begin to get the incisions wet washing with warm soapy water and dry thoroughly after.  Steri-Strips can remain intact if they get loose in the shower can be removed.  Return in about 2 weeks (around 01/25/2022) for 3rd POV R haglunds resection.         Corinna Gab, DPM [facility] / [facility]

## 2022-01-19 ENCOUNTER — Other Ambulatory Visit (INDEPENDENT_AMBULATORY_CARE_PROVIDER_SITE_OTHER): Payer: Medicare HMO | Admitting: Podiatry

## 2022-01-19 ENCOUNTER — Telehealth: Payer: Self-pay | Admitting: Podiatry

## 2022-01-19 DIAGNOSIS — M9261 Juvenile osteochondrosis of tarsus, right ankle: Secondary | ICD-10-CM

## 2022-01-19 MED ORDER — RIVAROXABAN 10 MG PO TABS: 10.0000 mg | ORAL_TABLET | Freq: Every day | ORAL | 0 refills | Status: AC

## 2022-01-19 NOTE — Progress Notes (Signed)
Refill of xarelto 10 mg daily sent for dvt ppx as pt is still NWB

## 2022-01-19 NOTE — Telephone Encounter (Signed)
Pt wants to know if she should continue taking the rivaroxaban (XARELTO) 10 MG TABS tablet. If so, she is in need of a refill.  [facility] - [place]   Please advise

## 2022-01-25 ENCOUNTER — Ambulatory Visit (INDEPENDENT_AMBULATORY_CARE_PROVIDER_SITE_OTHER): Payer: Medicare HMO | Admitting: Podiatry

## 2022-01-25 DIAGNOSIS — M7661 Achilles tendinitis, right leg: Secondary | ICD-10-CM

## 2022-01-25 DIAGNOSIS — Z9889 Other specified postprocedural states: Secondary | ICD-10-CM

## 2022-01-25 DIAGNOSIS — M9261 Juvenile osteochondrosis of tarsus, right ankle: Secondary | ICD-10-CM

## 2022-01-25 NOTE — Progress Notes (Signed)
  Subjective:  Patient ID: Anna Mclaughlin, female    DOB: [birth year],  MRN: [id number]  Chief Complaint  Patient presents with   Routine Post Op    POV # 3 DOS 12/23/21 --- RIGHT RETROCALCANEAL EXOSTECTOMY WITH ACHILLES TENDON REPAIR, POSSIBLE GASTROCNEMIUS RECESSION    DOS: 12/23/2021 Procedure: Right lower extremity gastrocnemius recession, retrocalcaneal exostectomy and secondary repair of Achilles tendon right  66 y.o. female returns for 3rd post-op check now approx 5 weeks post op.  She reports she has been doing well since last visit.  Has been NWB in CAM boot.  She denies any significant pain and is not taking any pain medication at this point time.  She is taking Xarelto 10 mg daily for DVT prophylaxis.  Review of Systems: Negative except as noted in the HPI. Denies N/V/F/Ch.   Objective:  There were no vitals filed for this visit. There is no height or weight on file to calculate BMI. Constitutional Well developed. Well nourished.  Vascular Foot warm and well perfused. Capillary refill normal to all digits.  Calf is soft and supple, no posterior calf or knee pain, negative Homans' sign  Neurologic Normal speech. Oriented to person, place, and time. Epicritic sensation to light touch grossly present bilaterally.  Dermatologic Skin healing well without signs of infection. Skin edges well coapted and healing well. Ad distal incision there is now a small circular pressure ulceration to level of subcutaneous fat tissue with fibrotic tissue in base. No surrounding erythema edema or drainage.  Gastroc incision well healed at this time.    Orthopedic: Minimal to no tenderness to palpation noted about the surgical site.  Patient has positive plantarflexion strength no evidence of disruption of the hardware clinically.   Multiple view plain film radiographs: XR deferred at this  visit Assessment:   1. Post-operative state   2. Haglund's deformity of right heel   3. Achilles  tendinitis, right leg    Plan:  Patient was evaluated and treated and all questions answered.  S/p foot surgery right lower extremity gastroc recession with Haglund's resection and retrocalcaneal exostectomy and secondary repair of Achilles -Overall progressing well, now concern for small pressure ulceration at the posterior heel, likely from irritation from the CAM boot. No evidence of infection or deep wound.  -XR: Deferred at this visit -WB Status: Continue Nonweightbearing in cam boot to right lower extremity with a 1 inch heel lift present in the heel. - Begin daily wound care to the posterior heel with betadine and adhesive bandage.  Keep area dry do not shower until it has scabbed over. -Medications:  Continue Xarelto 10 mg tablets daily for DVT prophylaxis. -Foot redressed with Betadine and adhesive bandage   Return in about 3 weeks (around 02/15/2022) for 4th POV R retrocalc exostectomy .         Corinna Gab, DPM [facility] / [facility]

## 2022-02-12 ENCOUNTER — Encounter: Payer: Medicare HMO | Admitting: Podiatry

## 2022-02-19 ENCOUNTER — Ambulatory Visit (INDEPENDENT_AMBULATORY_CARE_PROVIDER_SITE_OTHER): Payer: Medicare HMO | Admitting: Podiatry

## 2022-02-19 DIAGNOSIS — Z9889 Other specified postprocedural states: Secondary | ICD-10-CM

## 2022-02-19 DIAGNOSIS — M7661 Achilles tendinitis, right leg: Secondary | ICD-10-CM

## 2022-02-19 DIAGNOSIS — M9261 Juvenile osteochondrosis of tarsus, right ankle: Secondary | ICD-10-CM

## 2022-02-19 NOTE — Progress Notes (Signed)
  Subjective:  Patient ID: Anna Mclaughlin, female    DOB: [birth year],  MRN: [id number]  Chief Complaint  Patient presents with   Routine Post Op    POV # 4 DOS 12/23/21 --- RIGHT RETROCALCANEAL EXOSTECTOMY WITH ACHILLES TENDON REPAIR, POSSIBLE GASTROCNEMIUS RECESSION    DOS: 12/23/2021 Procedure: Right lower extremity gastrocnemius recession, retrocalcaneal exostectomy and secondary repair of Achilles tendon right  67 y.o. female returns for 4th post-op check now approx 8 weeks post op.  She reports she has been doing well since last visit.  Has been weightbearing in CAM boot.  She denies any significant pain and is not taking any pain medication at this point time.  Has been placing a Band-Aid and Betadine ointment on the back of the right heel which has been improving the small area of ulceration present there is a scab present now.  Review of Systems: Negative except as noted in the HPI. Denies N/V/F/Ch.   Objective:  There were no vitals filed for this visit. There is no height or weight on file to calculate BMI. Constitutional Well developed. Well nourished.  Vascular Foot warm and well perfused. Capillary refill normal to all digits.  Calf is soft and supple, no posterior calf or knee pain, negative Homans' sign  Neurologic Normal speech. Oriented to person, place, and time. Epicritic sensation to light touch grossly present bilaterally.  Dermatologic Skin healing well without minimal scar tissue formation.  Skin edges well coapted and healing well.  At the distal aspect of the incision there is a small eschar at prior area of open wound.  No underlying opening with debridement of the eschar.  No surrounding erythema or edema no drainage.  Gastroc incision well healed at this time.    Orthopedic: Minimal to no tenderness to palpation noted about the surgical site.  Patient has positive plantarflexion strength no evidence of disruption of the hardware clinically.   Multiple view  plain film radiographs: XR deferred at this  visit Assessment:   1. Post-operative state   2. Haglund's deformity of right heel   3. Achilles tendinitis, right leg     Plan:  Patient was evaluated and treated and all questions answered.  S/p foot surgery right lower extremity gastroc recession with Haglund's resection and retrocalcaneal exostectomy and secondary repair of Achilles -Overall progressing well, the wound at the distal aspect of the incision has improved and was healing with local wound care -XR: Deferred at this visit, will plan to take further x-rays at next postoperative visit -WB Status: Okay to continue weightbearing in cam boot and transition back to regular shoe gear as able. -Continue daily wound care to the posterior heel with betadine and adhesive bandage until the area is fully healed. -Medications: No further medications either pain or DVT prophylaxis needed as patient is nonweightbearing -Foot redressed with Betadine and adhesive bandage   Return in about 6 weeks (around 04/02/2022) for Follow up L retrocalc exostectomy, achilles repair.         Everitt Amber, DPM [facility]

## 2022-03-22 DIAGNOSIS — H918X1 Other specified hearing loss, right ear: Secondary | ICD-10-CM | POA: Diagnosis not present

## 2022-03-22 DIAGNOSIS — K219 Gastro-esophageal reflux disease without esophagitis: Secondary | ICD-10-CM | POA: Diagnosis not present

## 2022-03-22 DIAGNOSIS — I10 Essential (primary) hypertension: Secondary | ICD-10-CM | POA: Diagnosis not present

## 2022-03-22 DIAGNOSIS — E782 Mixed hyperlipidemia: Secondary | ICD-10-CM | POA: Diagnosis not present

## 2022-03-22 DIAGNOSIS — E669 Obesity, unspecified: Secondary | ICD-10-CM | POA: Diagnosis not present

## 2022-03-22 DIAGNOSIS — R5383 Other fatigue: Secondary | ICD-10-CM | POA: Diagnosis not present

## 2022-03-22 DIAGNOSIS — R69 Illness, unspecified: Secondary | ICD-10-CM | POA: Diagnosis not present

## 2022-03-22 DIAGNOSIS — M159 Polyosteoarthritis, unspecified: Secondary | ICD-10-CM | POA: Diagnosis not present

## 2022-03-22 DIAGNOSIS — M5136 Other intervertebral disc degeneration, lumbar region: Secondary | ICD-10-CM | POA: Diagnosis not present

## 2022-03-22 DIAGNOSIS — N959 Unspecified menopausal and perimenopausal disorder: Secondary | ICD-10-CM | POA: Diagnosis not present

## 2022-03-22 DIAGNOSIS — G8929 Other chronic pain: Secondary | ICD-10-CM | POA: Diagnosis not present

## 2022-03-22 DIAGNOSIS — R5381 Other malaise: Secondary | ICD-10-CM | POA: Diagnosis not present

## 2022-03-22 DIAGNOSIS — R7303 Prediabetes: Secondary | ICD-10-CM | POA: Diagnosis not present

## 2022-03-22 DIAGNOSIS — Z79899 Other long term (current) drug therapy: Secondary | ICD-10-CM | POA: Diagnosis not present

## 2022-04-02 ENCOUNTER — Ambulatory Visit: Payer: Medicare HMO | Admitting: Podiatry

## 2022-04-12 DIAGNOSIS — R197 Diarrhea, unspecified: Secondary | ICD-10-CM | POA: Diagnosis not present

## 2022-05-31 DIAGNOSIS — F5101 Primary insomnia: Secondary | ICD-10-CM | POA: Diagnosis not present

## 2022-05-31 DIAGNOSIS — F411 Generalized anxiety disorder: Secondary | ICD-10-CM | POA: Diagnosis not present

## 2022-05-31 DIAGNOSIS — Z79899 Other long term (current) drug therapy: Secondary | ICD-10-CM | POA: Diagnosis not present

## 2022-05-31 DIAGNOSIS — R69 Illness, unspecified: Secondary | ICD-10-CM | POA: Diagnosis not present

## 2022-05-31 DIAGNOSIS — F330 Major depressive disorder, recurrent, mild: Secondary | ICD-10-CM | POA: Diagnosis not present

## 2022-07-21 DIAGNOSIS — Z1231 Encounter for screening mammogram for malignant neoplasm of breast: Secondary | ICD-10-CM | POA: Diagnosis not present

## 2022-11-05 DIAGNOSIS — Z23 Encounter for immunization: Secondary | ICD-10-CM | POA: Diagnosis not present

## 2022-11-25 DIAGNOSIS — F5101 Primary insomnia: Secondary | ICD-10-CM | POA: Diagnosis not present

## 2022-11-25 DIAGNOSIS — F411 Generalized anxiety disorder: Secondary | ICD-10-CM | POA: Diagnosis not present

## 2022-11-25 DIAGNOSIS — Z79899 Other long term (current) drug therapy: Secondary | ICD-10-CM | POA: Diagnosis not present

## 2022-11-25 DIAGNOSIS — F330 Major depressive disorder, recurrent, mild: Secondary | ICD-10-CM | POA: Diagnosis not present

## 2022-12-07 DIAGNOSIS — Z800 Family history of malignant neoplasm of digestive organs: Secondary | ICD-10-CM | POA: Diagnosis not present

## 2022-12-07 DIAGNOSIS — N959 Unspecified menopausal and perimenopausal disorder: Secondary | ICD-10-CM | POA: Diagnosis not present

## 2022-12-07 DIAGNOSIS — Z0000 Encounter for general adult medical examination without abnormal findings: Secondary | ICD-10-CM | POA: Diagnosis not present

## 2022-12-07 DIAGNOSIS — M791 Myalgia, unspecified site: Secondary | ICD-10-CM | POA: Diagnosis not present

## 2022-12-07 DIAGNOSIS — E782 Mixed hyperlipidemia: Secondary | ICD-10-CM | POA: Diagnosis not present

## 2022-12-07 DIAGNOSIS — Z79899 Other long term (current) drug therapy: Secondary | ICD-10-CM | POA: Diagnosis not present

## 2022-12-07 DIAGNOSIS — F411 Generalized anxiety disorder: Secondary | ICD-10-CM | POA: Diagnosis not present

## 2022-12-07 DIAGNOSIS — R7303 Prediabetes: Secondary | ICD-10-CM | POA: Diagnosis not present

## 2022-12-07 DIAGNOSIS — Z1211 Encounter for screening for malignant neoplasm of colon: Secondary | ICD-10-CM | POA: Diagnosis not present

## 2022-12-07 DIAGNOSIS — F5101 Primary insomnia: Secondary | ICD-10-CM | POA: Diagnosis not present

## 2022-12-07 DIAGNOSIS — E669 Obesity, unspecified: Secondary | ICD-10-CM | POA: Diagnosis not present

## 2022-12-07 DIAGNOSIS — E559 Vitamin D deficiency, unspecified: Secondary | ICD-10-CM | POA: Diagnosis not present

## 2022-12-07 DIAGNOSIS — F330 Major depressive disorder, recurrent, mild: Secondary | ICD-10-CM | POA: Diagnosis not present

## 2023-01-05 DIAGNOSIS — H2513 Age-related nuclear cataract, bilateral: Secondary | ICD-10-CM | POA: Diagnosis not present

## 2023-01-26 DIAGNOSIS — H2513 Age-related nuclear cataract, bilateral: Secondary | ICD-10-CM | POA: Diagnosis not present

## 2023-01-26 DIAGNOSIS — H35372 Puckering of macula, left eye: Secondary | ICD-10-CM | POA: Diagnosis not present

## 2023-01-26 DIAGNOSIS — Z01818 Encounter for other preprocedural examination: Secondary | ICD-10-CM | POA: Diagnosis not present

## 2023-01-26 DIAGNOSIS — H2512 Age-related nuclear cataract, left eye: Secondary | ICD-10-CM | POA: Diagnosis not present

## 2023-02-18 DIAGNOSIS — I10 Essential (primary) hypertension: Secondary | ICD-10-CM | POA: Diagnosis not present

## 2023-02-18 DIAGNOSIS — H2512 Age-related nuclear cataract, left eye: Secondary | ICD-10-CM | POA: Diagnosis not present

## 2023-03-04 DIAGNOSIS — H2511 Age-related nuclear cataract, right eye: Secondary | ICD-10-CM | POA: Diagnosis not present

## 2023-03-04 DIAGNOSIS — I10 Essential (primary) hypertension: Secondary | ICD-10-CM | POA: Diagnosis not present

## 2023-03-23 DIAGNOSIS — R7303 Prediabetes: Secondary | ICD-10-CM | POA: Diagnosis not present

## 2023-03-23 DIAGNOSIS — F411 Generalized anxiety disorder: Secondary | ICD-10-CM | POA: Diagnosis not present

## 2023-03-23 DIAGNOSIS — F5101 Primary insomnia: Secondary | ICD-10-CM | POA: Diagnosis not present

## 2023-03-23 DIAGNOSIS — F330 Major depressive disorder, recurrent, mild: Secondary | ICD-10-CM | POA: Diagnosis not present

## 2023-03-23 DIAGNOSIS — E669 Obesity, unspecified: Secondary | ICD-10-CM | POA: Diagnosis not present

## 2023-03-23 DIAGNOSIS — Z79899 Other long term (current) drug therapy: Secondary | ICD-10-CM | POA: Diagnosis not present

## 2023-03-23 DIAGNOSIS — N959 Unspecified menopausal and perimenopausal disorder: Secondary | ICD-10-CM | POA: Diagnosis not present

## 2023-03-23 DIAGNOSIS — I10 Essential (primary) hypertension: Secondary | ICD-10-CM | POA: Diagnosis not present

## 2023-03-23 DIAGNOSIS — E782 Mixed hyperlipidemia: Secondary | ICD-10-CM | POA: Diagnosis not present

## 2023-03-23 DIAGNOSIS — M791 Myalgia, unspecified site: Secondary | ICD-10-CM | POA: Diagnosis not present

## 2023-03-23 DIAGNOSIS — K219 Gastro-esophageal reflux disease without esophagitis: Secondary | ICD-10-CM | POA: Diagnosis not present

## 2023-03-23 DIAGNOSIS — M5136 Other intervertebral disc degeneration, lumbar region with discogenic back pain only: Secondary | ICD-10-CM | POA: Diagnosis not present

## 2023-04-07 DIAGNOSIS — Z0100 Encounter for examination of eyes and vision without abnormal findings: Secondary | ICD-10-CM | POA: Diagnosis not present

## 2023-05-23 DIAGNOSIS — Z79899 Other long term (current) drug therapy: Secondary | ICD-10-CM | POA: Diagnosis not present

## 2023-05-23 DIAGNOSIS — F5101 Primary insomnia: Secondary | ICD-10-CM | POA: Diagnosis not present

## 2023-05-23 DIAGNOSIS — F411 Generalized anxiety disorder: Secondary | ICD-10-CM | POA: Diagnosis not present

## 2023-05-23 DIAGNOSIS — F330 Major depressive disorder, recurrent, mild: Secondary | ICD-10-CM | POA: Diagnosis not present

## 2023-06-08 DIAGNOSIS — Z800 Family history of malignant neoplasm of digestive organs: Secondary | ICD-10-CM | POA: Diagnosis not present

## 2023-06-08 DIAGNOSIS — Z1211 Encounter for screening for malignant neoplasm of colon: Secondary | ICD-10-CM | POA: Diagnosis not present

## 2023-06-20 DIAGNOSIS — Z1211 Encounter for screening for malignant neoplasm of colon: Secondary | ICD-10-CM | POA: Diagnosis not present

## 2023-06-20 DIAGNOSIS — Z882 Allergy status to sulfonamides status: Secondary | ICD-10-CM | POA: Diagnosis not present

## 2023-06-20 DIAGNOSIS — Z79899 Other long term (current) drug therapy: Secondary | ICD-10-CM | POA: Diagnosis not present

## 2023-06-20 DIAGNOSIS — I10 Essential (primary) hypertension: Secondary | ICD-10-CM | POA: Diagnosis not present

## 2023-06-20 DIAGNOSIS — Z800 Family history of malignant neoplasm of digestive organs: Secondary | ICD-10-CM | POA: Diagnosis not present

## 2023-06-20 DIAGNOSIS — E785 Hyperlipidemia, unspecified: Secondary | ICD-10-CM | POA: Diagnosis not present

## 2023-06-20 DIAGNOSIS — Z880 Allergy status to penicillin: Secondary | ICD-10-CM | POA: Diagnosis not present

## 2023-06-20 DIAGNOSIS — K219 Gastro-esophageal reflux disease without esophagitis: Secondary | ICD-10-CM | POA: Diagnosis not present

## 2023-08-25 DIAGNOSIS — Z1231 Encounter for screening mammogram for malignant neoplasm of breast: Secondary | ICD-10-CM | POA: Diagnosis not present

## 2023-10-27 DIAGNOSIS — I10 Essential (primary) hypertension: Secondary | ICD-10-CM | POA: Diagnosis not present

## 2023-10-27 DIAGNOSIS — Z23 Encounter for immunization: Secondary | ICD-10-CM | POA: Diagnosis not present

## 2023-10-27 DIAGNOSIS — M5030 Other cervical disc degeneration, unspecified cervical region: Secondary | ICD-10-CM | POA: Diagnosis not present

## 2023-11-02 DIAGNOSIS — M47812 Spondylosis without myelopathy or radiculopathy, cervical region: Secondary | ICD-10-CM | POA: Diagnosis not present

## 2023-11-02 DIAGNOSIS — M5030 Other cervical disc degeneration, unspecified cervical region: Secondary | ICD-10-CM | POA: Diagnosis not present

## 2023-11-14 DIAGNOSIS — F5101 Primary insomnia: Secondary | ICD-10-CM | POA: Diagnosis not present

## 2023-11-14 DIAGNOSIS — Z79899 Other long term (current) drug therapy: Secondary | ICD-10-CM | POA: Diagnosis not present

## 2023-11-14 DIAGNOSIS — F411 Generalized anxiety disorder: Secondary | ICD-10-CM | POA: Diagnosis not present

## 2023-11-14 DIAGNOSIS — F330 Major depressive disorder, recurrent, mild: Secondary | ICD-10-CM | POA: Diagnosis not present

## 2023-12-07 DIAGNOSIS — M47812 Spondylosis without myelopathy or radiculopathy, cervical region: Secondary | ICD-10-CM | POA: Diagnosis not present

## 2023-12-08 DIAGNOSIS — Z0000 Encounter for general adult medical examination without abnormal findings: Secondary | ICD-10-CM | POA: Diagnosis not present

## 2023-12-08 DIAGNOSIS — K219 Gastro-esophageal reflux disease without esophagitis: Secondary | ICD-10-CM | POA: Diagnosis not present

## 2023-12-08 DIAGNOSIS — F411 Generalized anxiety disorder: Secondary | ICD-10-CM | POA: Diagnosis not present

## 2023-12-08 DIAGNOSIS — R7303 Prediabetes: Secondary | ICD-10-CM | POA: Diagnosis not present

## 2023-12-08 DIAGNOSIS — I8391 Asymptomatic varicose veins of right lower extremity: Secondary | ICD-10-CM | POA: Diagnosis not present

## 2023-12-08 DIAGNOSIS — M5030 Other cervical disc degeneration, unspecified cervical region: Secondary | ICD-10-CM | POA: Diagnosis not present

## 2023-12-08 DIAGNOSIS — M9262 Juvenile osteochondrosis of tarsus, left ankle: Secondary | ICD-10-CM | POA: Diagnosis not present

## 2023-12-08 DIAGNOSIS — H9193 Unspecified hearing loss, bilateral: Secondary | ICD-10-CM | POA: Diagnosis not present

## 2023-12-08 DIAGNOSIS — Z800 Family history of malignant neoplasm of digestive organs: Secondary | ICD-10-CM | POA: Diagnosis not present

## 2023-12-08 DIAGNOSIS — I10 Essential (primary) hypertension: Secondary | ICD-10-CM | POA: Diagnosis not present

## 2023-12-08 DIAGNOSIS — M5136 Other intervertebral disc degeneration, lumbar region with discogenic back pain only: Secondary | ICD-10-CM | POA: Diagnosis not present

## 2023-12-08 DIAGNOSIS — M150 Primary generalized (osteo)arthritis: Secondary | ICD-10-CM | POA: Diagnosis not present

## 2023-12-08 DIAGNOSIS — M546 Pain in thoracic spine: Secondary | ICD-10-CM | POA: Diagnosis not present

## 2023-12-19 DIAGNOSIS — M542 Cervicalgia: Secondary | ICD-10-CM | POA: Diagnosis not present

## 2023-12-21 DIAGNOSIS — M542 Cervicalgia: Secondary | ICD-10-CM | POA: Diagnosis not present

## 2023-12-26 DIAGNOSIS — M542 Cervicalgia: Secondary | ICD-10-CM | POA: Diagnosis not present

## 2023-12-28 DIAGNOSIS — M542 Cervicalgia: Secondary | ICD-10-CM | POA: Diagnosis not present

## 2024-01-02 DIAGNOSIS — M542 Cervicalgia: Secondary | ICD-10-CM | POA: Diagnosis not present

## 2024-01-04 DIAGNOSIS — M542 Cervicalgia: Secondary | ICD-10-CM | POA: Diagnosis not present

## 2024-01-16 DIAGNOSIS — M542 Cervicalgia: Secondary | ICD-10-CM | POA: Diagnosis not present

## 2024-01-18 DIAGNOSIS — M542 Cervicalgia: Secondary | ICD-10-CM | POA: Diagnosis not present

## 2024-01-19 DIAGNOSIS — G8929 Other chronic pain: Secondary | ICD-10-CM | POA: Diagnosis not present

## 2024-01-19 DIAGNOSIS — M7918 Myalgia, other site: Secondary | ICD-10-CM | POA: Diagnosis not present

## 2024-01-19 DIAGNOSIS — M542 Cervicalgia: Secondary | ICD-10-CM | POA: Diagnosis not present

## 2024-01-23 DIAGNOSIS — M542 Cervicalgia: Secondary | ICD-10-CM | POA: Diagnosis not present

## 2024-01-25 DIAGNOSIS — M542 Cervicalgia: Secondary | ICD-10-CM | POA: Diagnosis not present

## 2024-01-30 DIAGNOSIS — M542 Cervicalgia: Secondary | ICD-10-CM | POA: Diagnosis not present

## 2024-02-02 DIAGNOSIS — M542 Cervicalgia: Secondary | ICD-10-CM | POA: Diagnosis not present

## 2024-02-06 DIAGNOSIS — M542 Cervicalgia: Secondary | ICD-10-CM | POA: Diagnosis not present
